# Patient Record
Sex: Female | Born: 1940
Health system: Southern US, Community
[De-identification: ages and names within clinical notes are randomized; demographics above are authoritative.]

## PROBLEM LIST (undated history)

## (undated) DIAGNOSIS — Z79890 Hormone replacement therapy (postmenopausal): Secondary | ICD-10-CM

## (undated) DIAGNOSIS — M1990 Unspecified osteoarthritis, unspecified site: Secondary | ICD-10-CM

## (undated) DIAGNOSIS — N200 Calculus of kidney: Secondary | ICD-10-CM

## (undated) DIAGNOSIS — R918 Other nonspecific abnormal finding of lung field: Secondary | ICD-10-CM

## (undated) DIAGNOSIS — E785 Hyperlipidemia, unspecified: Secondary | ICD-10-CM

## (undated) DIAGNOSIS — I7300 Raynaud's syndrome without gangrene: Secondary | ICD-10-CM

## (undated) DIAGNOSIS — C801 Malignant (primary) neoplasm, unspecified: Secondary | ICD-10-CM

## (undated) HISTORY — PX: LITHOTRIPSY: SUR834

## (undated) HISTORY — DX: Other nonspecific abnormal finding of lung field: R91.8

## (undated) HISTORY — DX: Raynaud's syndrome without gangrene: I73.00

## (undated) HISTORY — DX: Calculus of kidney: N20.0

## (undated) HISTORY — PX: TONSILLECTOMY: SUR1361

## (undated) HISTORY — DX: Hormone replacement therapy: Z79.890

## (undated) HISTORY — DX: Hyperlipidemia, unspecified: E78.5

## (undated) HISTORY — DX: Unspecified osteoarthritis, unspecified site: M19.90

---

## 2004-12-28 ENCOUNTER — Ambulatory Visit: Payer: Self-pay | Admitting: Unknown Physician Specialty

## 2005-08-03 ENCOUNTER — Ambulatory Visit: Payer: Self-pay | Admitting: General Surgery

## 2005-08-03 HISTORY — PX: OTHER SURGICAL HISTORY: SHX169

## 2006-01-24 ENCOUNTER — Ambulatory Visit: Payer: Self-pay | Admitting: Unknown Physician Specialty

## 2007-02-06 ENCOUNTER — Ambulatory Visit: Payer: Self-pay | Admitting: Unknown Physician Specialty

## 2008-02-26 ENCOUNTER — Ambulatory Visit: Payer: Self-pay | Admitting: Unknown Physician Specialty

## 2008-03-15 ENCOUNTER — Ambulatory Visit: Payer: Self-pay | Admitting: Unknown Physician Specialty

## 2009-03-18 ENCOUNTER — Ambulatory Visit: Payer: Self-pay | Admitting: Unknown Physician Specialty

## 2010-03-31 ENCOUNTER — Ambulatory Visit: Payer: Self-pay | Admitting: Unknown Physician Specialty

## 2011-05-11 LAB — LIPID PANEL
Cholesterol: 167 mg/dL (ref 0–200)
HDL: 43 mg/dL (ref 35–70)
LDL Cholesterol: 100 mg/dL

## 2011-05-11 LAB — CBC AND DIFFERENTIAL
Platelets: 249 10*3/uL (ref 150–399)
WBC: 5.9 10^3/mL

## 2011-06-29 ENCOUNTER — Ambulatory Visit: Payer: Self-pay | Admitting: Unknown Physician Specialty

## 2011-06-29 LAB — HM MAMMOGRAPHY

## 2011-12-10 LAB — HM DEXA SCAN: HM Dexa Scan: NORMAL

## 2012-07-04 ENCOUNTER — Ambulatory Visit: Payer: Self-pay | Admitting: Internal Medicine

## 2012-11-18 ENCOUNTER — Ambulatory Visit: Payer: Self-pay | Admitting: Internal Medicine

## 2012-12-09 ENCOUNTER — Ambulatory Visit: Payer: Self-pay | Admitting: Internal Medicine

## 2012-12-26 ENCOUNTER — Encounter: Payer: Self-pay | Admitting: *Deleted

## 2012-12-29 ENCOUNTER — Encounter: Payer: Self-pay | Admitting: Internal Medicine

## 2012-12-29 ENCOUNTER — Ambulatory Visit (INDEPENDENT_AMBULATORY_CARE_PROVIDER_SITE_OTHER): Payer: Medicare Other | Admitting: Internal Medicine

## 2012-12-29 VITALS — BP 140/82 | HR 68 | Temp 98.4°F | Ht 66.5 in | Wt 151.5 lb

## 2012-12-29 DIAGNOSIS — E7800 Pure hypercholesterolemia, unspecified: Secondary | ICD-10-CM

## 2012-12-29 DIAGNOSIS — R5381 Other malaise: Secondary | ICD-10-CM

## 2012-12-29 DIAGNOSIS — R5383 Other fatigue: Secondary | ICD-10-CM

## 2012-12-29 DIAGNOSIS — N200 Calculus of kidney: Secondary | ICD-10-CM

## 2012-12-29 DIAGNOSIS — E559 Vitamin D deficiency, unspecified: Secondary | ICD-10-CM

## 2012-12-29 DIAGNOSIS — C4490 Unspecified malignant neoplasm of skin, unspecified: Secondary | ICD-10-CM

## 2012-12-30 ENCOUNTER — Encounter: Payer: Self-pay | Admitting: Internal Medicine

## 2012-12-30 DIAGNOSIS — E559 Vitamin D deficiency, unspecified: Secondary | ICD-10-CM | POA: Insufficient documentation

## 2012-12-30 DIAGNOSIS — C4490 Unspecified malignant neoplasm of skin, unspecified: Secondary | ICD-10-CM | POA: Insufficient documentation

## 2012-12-30 DIAGNOSIS — N200 Calculus of kidney: Secondary | ICD-10-CM | POA: Insufficient documentation

## 2012-12-30 DIAGNOSIS — E7800 Pure hypercholesterolemia, unspecified: Secondary | ICD-10-CM | POA: Insufficient documentation

## 2012-12-30 NOTE — Assessment & Plan Note (Signed)
Currently asymptomatic.  Follow.     

## 2012-12-30 NOTE — Assessment & Plan Note (Signed)
On simvastatin.  Low cholesterol diet and exercise.  Just had cholesterol checked 12/13.  Obtain results.

## 2012-12-30 NOTE — Assessment & Plan Note (Signed)
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  Seeing Dr Isenstein.    

## 2012-12-30 NOTE — Assessment & Plan Note (Signed)
Documented in pt notes.  Continue supplements.  Follow.   

## 2012-12-30 NOTE — Progress Notes (Signed)
Subjective:    Patient ID: Kristin Phelps, female    DOB: [birth year], 72 y.o.   MRN: [id number]  HPI 72 year old female with past history of hypercholesterolemia and nephrolithiasis who comes in today to follow up on these issues as well as to establish care.  She previously saw Dr Lin Givens and recently has been seeing Dr Alison Murray.  She states she is doing well.  Is active.  No cardiac symptoms with increased activity or exertion.  Breathing stable.  Had her last physical 05/20/12.  Was having issues with back pain in 12/13.  Xray revealed arthritis.  Better now.  Occasionally will notice some minimal discomfort with certain movements.   No significant pain or problems now.  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  Busy season starting.  Bowels stable.  Overall she feels she is doing well.    Past Medical History  Diagnosis Date  . GERD (gastroesophageal reflux disease)   . Hyperlipidemia   . Postmenopausal HRT (hormone replacement therapy)   . Nephrolithiasis   . Arthritis     Current Outpatient Prescriptions on File Prior to Visit  Medication Sig Dispense Refill  . estropipate (OGEN) 0.75 MG tablet Take one tablet by mouth four days weekly.      . medroxyPROGESTERone (PROVERA) 2.5 MG tablet Take 2.5 mg by mouth daily.      . simvastatin (ZOCOR) 20 MG tablet Take 20 mg by mouth daily.        Review of Systems Patient denies any headache, lightheadedness or dizziness.  No sinus or allergy symptoms.   No chest pain, tightness or palpitations.  No increased shortness of breath, cough or congestion.  No nausea or vomiting.  No acid reflux.  No abdominal pain or cramping.  No bowel change, such as diarrhea, constipation, BRBPR or melana.  No urine change.   Taking her estrogen four days per week.  Discussed my desire to gradually wean her off.  Has been on for years.  States she feels better on the estrogen and would like to remain on the estrogen for now.  Some fatigue, but  overall feels she is doing well.      Objective:   Physical Exam Filed Vitals:   12/29/12 1433  BP: 140/82  Pulse: 68  Temp: 98.4 F (36.9 C)   Blood pressure recheck:  57/75  72 year old female in no acute distress.   HEENT:  Nares- clear.  Oropharynx - without lesions. NECK:  Supple.  Nontender.  No audible bruit.  HEART:  Appears to be regular. LUNGS:  No crackles or wheezing audible.  Respirations even and unlabored.  RADIAL PULSE:  Equal bilaterally.    BREASTS:  Performed by Dr Alison Murray in 6/13.  ABDOMEN:  Soft, nontender.  Bowel sounds present and normal.  No audible abdominal bruit.  GU:  Performed by Dr Alison Murray in 6/13.  EXTREMITIES:  No increased edema present.  DP pulses palpable and equal bilaterally.          Assessment & Plan:  ESTROGEN USAGE.  Discussed continued use with her.  She has been on - for years.  Desires to remain on the estrogen for now.  Ttaking the estrogen - 4 days/week.  Will discuss more with her in the future.  Has a family history of breast cancer.  Discussed this with her as well.    FAMILY HISTORY OF BREAST CANCER.  Discussed genetic testing.  BACK PAIN.  Not a significant issue for her now.  Follow.    FAMILY HISTORY OF ANEURYSM.  Discussed screening.    HEALTH MAINTENANCE.  Schedule a physical in 6/14.  Last 05/20/12.  Mammogram 07/04/12 - per her report - ok.  Obtain results.  Colonoscopy 2005.  Recommended follow up colonoscopy 10 years.  Declines vaccinations.  Bone density 12/10/11 normal.

## 2013-06-23 ENCOUNTER — Other Ambulatory Visit (INDEPENDENT_AMBULATORY_CARE_PROVIDER_SITE_OTHER): Payer: Medicare Other

## 2013-06-23 DIAGNOSIS — R5383 Other fatigue: Secondary | ICD-10-CM

## 2013-06-23 DIAGNOSIS — E7800 Pure hypercholesterolemia, unspecified: Secondary | ICD-10-CM

## 2013-06-23 LAB — LIPID PANEL
Cholesterol: 172 mg/dL (ref 0–200)
HDL: 45.9 mg/dL
LDL Cholesterol: 103 mg/dL — ABNORMAL HIGH (ref 0–99)
Total CHOL/HDL Ratio: 4
Triglycerides: 114 mg/dL (ref 0.0–149.0)
VLDL: 22.8 mg/dL (ref 0.0–40.0)

## 2013-06-23 LAB — CBC WITH DIFFERENTIAL/PLATELET
Basophils Relative: 1.1 % (ref 0.0–3.0)
Eosinophils Absolute: 0.3 10*3/uL (ref 0.0–0.7)
Eosinophils Relative: 4.8 % (ref 0.0–5.0)
Hemoglobin: 13.1 g/dL (ref 12.0–15.0)
Lymphocytes Relative: 15.8 % (ref 12.0–46.0)
MCHC: 33.8 g/dL (ref 30.0–36.0)
Neutro Abs: 4.8 10*3/uL (ref 1.4–7.7)
Neutrophils Relative %: 71 % (ref 43.0–77.0)
RBC: 4.29 Mil/uL (ref 3.87–5.11)
WBC: 6.8 10*3/uL (ref 4.5–10.5)

## 2013-06-23 LAB — COMPREHENSIVE METABOLIC PANEL
Albumin: 3.8 g/dL (ref 3.5–5.2)
BUN: 14 mg/dL (ref 6–23)
CO2: 27 mEq/L (ref 19–32)
Calcium: 9.2 mg/dL (ref 8.4–10.5)
Chloride: 107 mEq/L (ref 96–112)
Creatinine, Ser: 1.1 mg/dL (ref 0.4–1.2)
GFR: 54.46 mL/min — ABNORMAL LOW (ref >60.00)
Glucose, Bld: 90 mg/dL (ref 70–99)
Potassium: 4.2 mEq/L (ref 3.5–5.1)

## 2013-06-23 LAB — TSH: TSH: 3.33 u[IU]/mL (ref 0.35–5.50)

## 2013-06-30 ENCOUNTER — Ambulatory Visit (INDEPENDENT_AMBULATORY_CARE_PROVIDER_SITE_OTHER): Payer: Medicare Other | Admitting: Internal Medicine

## 2013-06-30 ENCOUNTER — Encounter: Payer: Self-pay | Admitting: Internal Medicine

## 2013-06-30 VITALS — BP 120/70 | HR 73 | Temp 98.9°F | Ht 65.0 in | Wt 155.5 lb

## 2013-06-30 DIAGNOSIS — R079 Chest pain, unspecified: Secondary | ICD-10-CM

## 2013-06-30 DIAGNOSIS — E7800 Pure hypercholesterolemia, unspecified: Secondary | ICD-10-CM

## 2013-06-30 DIAGNOSIS — C4490 Unspecified malignant neoplasm of skin, unspecified: Secondary | ICD-10-CM

## 2013-06-30 DIAGNOSIS — N200 Calculus of kidney: Secondary | ICD-10-CM

## 2013-06-30 DIAGNOSIS — E559 Vitamin D deficiency, unspecified: Secondary | ICD-10-CM

## 2013-06-30 MED ORDER — SIMVASTATIN 20 MG PO TABS: 20.0000 mg | ORAL_TABLET | Freq: Every day | ORAL | Status: DC

## 2013-06-30 MED ORDER — ESTROPIPATE 0.75 MG PO TABS: ORAL_TABLET | ORAL | Status: DC

## 2013-06-30 MED ORDER — MEDROXYPROGESTERONE ACETATE 2.5 MG PO TABS: 2.5000 mg | ORAL_TABLET | Freq: Every day | ORAL | Status: DC

## 2013-06-30 NOTE — Progress Notes (Signed)
Subjective:    Patient ID: Kristin Phelps, female    DOB: 04/11/1941, 72 y.o.   MRN: [id number]  HPI 72 year old female with past history of hypercholesterolemia and nephrolithiasis who comes in today to follow up on these issues as well as for a complete physical exam.   She states she is doing well.  Is active.  No cardiac symptoms with increased activity or exertion.  Has noticed some occasional chest discomfort.  No clear trigger.   Breathing stable.  No increased sob.   Was having issues with back pain in 12/13.  Xray revealed arthritis.  Better now.  Occasionally will notice some minimal discomfort with certain movements.   No significant pain or problems now.  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  Has leveled off now.  Bowels stable.      Past Medical History  Diagnosis Date  . GERD (gastroesophageal reflux disease)   . Hyperlipidemia   . Postmenopausal HRT (hormone replacement therapy)   . Nephrolithiasis   . Arthritis     Current Outpatient Prescriptions on File Prior to Visit  Medication Sig Dispense Refill  . Cholecalciferol (VITAMIN D-3) 1000 UNITS CAPS Take 1 capsule by mouth daily.      Marland Kitchen estropipate (OGEN) 0.75 MG tablet Take one tablet by mouth four days weekly.      . medroxyPROGESTERone (PROVERA) 2.5 MG tablet Take 2.5 mg by mouth daily.      . polycarbophil (FIBERCON) 625 MG tablet Take 625 mg by mouth daily.      . simvastatin (ZOCOR) 20 MG tablet Take 20 mg by mouth daily.       No current facility-administered medications on file prior to visit.    Review of Systems Patient denies any headache, lightheadedness or dizziness.  No sinus or allergy symptoms.   No palpitations.  Some intermittent chest discomfort.  Last very short time.  Resolves on its own.  No pain with increased activity or exertion.  No increased shortness of breath, cough or congestion.  No nausea or vomiting.  No acid  reflux.  No abdominal pain or cramping.  No bowel change, such as diarrhea, constipation, BRBPR or melana.  No urine change.   Taking her estrogen four days per week.  Discussed my desire to gradually wean her off.  Has been on for years.  States she feels better on the estrogen and would like to remain on the estrogen for now.  Blood pressures better.       Objective:   Physical Exam  Filed Vitals:   06/30/13 0836  BP: 120/70  Pulse: 73  Temp: 98.9 F (37.2 C)   Pulse 48  72 year old female in no acute distress.   HEENT:  Nares- clear.  Oropharynx - without lesions. NECK:  Supple.  Nontender.  No audible bruit.  HEART:  Appears to be regular. LUNGS:  No crackles or wheezing audible.  Respirations even and unlabored.  RADIAL PULSE:  Equal bilaterally.    BREASTS:  No nipple discharge or nipple retraction present.  Could not appreciate any distinct nodules or axillary adenopathy.  ABDOMEN:  Soft, nontender.  Bowel sounds present and normal.  No audible abdominal bruit.  GU:  Not performed.    EXTREMITIES:  No increased edema present.  DP pulses palpable and equal bilaterally.          Assessment & Plan:  CARDIOLOGY.  Intermittent pain as outlined.  EKG revealed SR with no acute ischemic changes.  Discussed further w/up including stress test.  She declined.  Wants to continue to monitor.  Will notify me if she changes her mind or if any change, worsening or persistent symptoms or problems.    ESTROGEN USAGE.  Discussed continued use with her.  She has been on - for years.  Desires to remain on the estrogen for now.  Ttaking the estrogen - 4 days/week.  Has a family history of breast cancer.  Discussed this with her as well.  Desires to remain on estrogen.   PREVIOUS ELEVATED BLOOD PRESSURE.  Blood pressure better now.  Follow.    FAMILY HISTORY OF BREAST CANCER.  Discussed genetic testing.    BACK PAIN.  Not a significant issue for her now.  Follow.    FAMILY HISTORY OF ANEURYSM.   Have discussed screening.    HEALTH MAINTENANCE.  Physical today.    Mammogram 07/04/12 - per her report - ok.  Gave her the information to schedule her own mammogram.   Colonoscopy 2005.  Recommended follow up colonoscopy 10 years.  Declines vaccinations.  Bone density 12/10/11 normal.

## 2013-07-04 ENCOUNTER — Encounter: Payer: Self-pay | Admitting: Internal Medicine

## 2013-07-04 NOTE — Assessment & Plan Note (Signed)
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  Seeing Dr Isenstein.    

## 2013-07-04 NOTE — Assessment & Plan Note (Signed)
Documented in pt notes.  Continue supplements.  Follow.   

## 2013-07-04 NOTE — Assessment & Plan Note (Signed)
Currently asymptomatic.  Follow.     

## 2013-07-04 NOTE — Assessment & Plan Note (Signed)
On simvastatin.  Low cholesterol diet and exercise.  Check lipid panel and liver function.  

## 2013-07-10 ENCOUNTER — Telehealth: Payer: Self-pay | Admitting: *Deleted

## 2013-07-10 NOTE — Telephone Encounter (Signed)
Left message for pt to return my call- Need to find out if pt has tried any other form of Estrogen? If not, is she willing to try another type? Insurance is not covering the Estropipate 0.75mg  tabs.

## 2013-07-16 ENCOUNTER — Encounter: Payer: Self-pay | Admitting: Internal Medicine

## 2013-07-20 ENCOUNTER — Other Ambulatory Visit: Payer: Self-pay | Admitting: *Deleted

## 2013-07-22 ENCOUNTER — Telehealth: Payer: Self-pay | Admitting: *Deleted

## 2013-07-22 NOTE — Telephone Encounter (Signed)
Called [employer] of  for prior authorization on the Estropipate 0.75 mg, received form, placed on Dr.Scotts folder

## 2013-07-24 ENCOUNTER — Ambulatory Visit: Payer: Self-pay | Admitting: Internal Medicine

## 2013-07-24 LAB — HM MAMMOGRAPHY

## 2013-08-04 ENCOUNTER — Telehealth: Payer: Self-pay | Admitting: Internal Medicine

## 2013-08-04 NOTE — Telephone Encounter (Signed)
My chart message sent to pt regarding her estrogen.

## 2013-08-06 ENCOUNTER — Encounter: Payer: Self-pay | Admitting: *Deleted

## 2013-08-06 ENCOUNTER — Encounter: Payer: Self-pay | Admitting: Internal Medicine

## 2013-08-10 ENCOUNTER — Telehealth: Payer: Self-pay | Admitting: *Deleted

## 2013-08-10 NOTE — Telephone Encounter (Signed)
Called insurance again, still haven't received form of estrogen that pt plans cover, they stated they are faxing over information now

## 2013-08-11 NOTE — Telephone Encounter (Signed)
[employer] called, estropipate (OGEN) 0.75 MG tablet has been APPROVED and pt has been notified

## 2013-10-08 ENCOUNTER — Other Ambulatory Visit: Payer: Self-pay

## 2013-11-17 ENCOUNTER — Telehealth: Payer: Self-pay | Admitting: Internal Medicine

## 2013-11-17 NOTE — Telephone Encounter (Signed)
Please advise if you would like to work her in somewhere this week for appt. Pt hasn't been seen since July

## 2013-11-17 NOTE — Telephone Encounter (Signed)
I would normally work her in this week, but if she is coughing up rusty colored/blood tinged sputum - I recommend appt today to confirm nothing more going on and then I can f/u with her.

## 2013-11-17 NOTE — Telephone Encounter (Signed)
Patient Information:  Caller Name: [place]  Phone: [phone]  Patient: Kristin Phelps, Kristin Phelps  Gender: Female  DOB: 1941-03-20  Age: 72 Years  PCP: Duncan Dull (Adults only)  Office Follow Up:  Does the office need to follow up with this patient?: Yes  Instructions For The Office: Please call and advise if this is possible.   Symptoms  Reason For Call & Symptoms: Pt is calling to see if a z-pac could be prescribed for her sx since there are no appts. Pt has had a cold/sore throat and productive cough x 10 days. Pt has productive yellow/green sputum with some blood streaks.  Reviewed Health History In EMR: Yes  Reviewed Medications In EMR: Yes  Reviewed Allergies In EMR: Yes  Reviewed Surgeries / Procedures: Yes  Date of Onset of Symptoms: 11/07/2013  Treatments Tried: salt water gargles; Mucinex  Treatments Tried Worked: No  Guideline(s) Used:  Cough  Disposition Per Guideline:   See Today in Office  Reason For Disposition Reached:   Coughing up rusty-colored (reddish-brown) or blood-tinged sputum  Advice Given:  N/A  Patient Refused Recommendation:  Patient Requests Prescription  Pt requesting a zpac be called into [facility].

## 2013-11-18 NOTE — Telephone Encounter (Signed)
Spoke with pt, states she continues to have cough with yellow-green sputum, blood was only seen when blowing her nose. Temp was below 100 today, has been using Mucinex earlier in the week with relief of symptoms. Pt took appt on 11/20/13 at 9:00, but states she will call and cancel if symptoms improve before then.

## 2013-11-18 NOTE — Telephone Encounter (Signed)
I can see her at 9:00 on 11/20/13 for evaluation if she is not going today.   If blood or worsening sx - needs eval prior.  thanks

## 2013-11-18 NOTE — Telephone Encounter (Signed)
Pt states that she would rather just try to "stick it out" - I informed pt to go to Acute care, Urgent Care, or ER if sx's conitnue or worsen. Pt went to work today & did okay (Hoarse now)

## 2013-11-20 ENCOUNTER — Ambulatory Visit (INDEPENDENT_AMBULATORY_CARE_PROVIDER_SITE_OTHER): Payer: Medicare Other | Admitting: Internal Medicine

## 2013-11-20 ENCOUNTER — Ambulatory Visit (INDEPENDENT_AMBULATORY_CARE_PROVIDER_SITE_OTHER)
Admission: RE | Admit: 2013-11-20 | Discharge: 2013-11-20 | Disposition: A | Discharge status: Home / Self Care | Payer: Medicare Other | Source: Ambulatory Visit | Attending: Internal Medicine | Admitting: Internal Medicine

## 2013-11-20 ENCOUNTER — Encounter: Payer: Self-pay | Admitting: Internal Medicine

## 2013-11-20 VITALS — BP 124/70 | HR 68 | Temp 98.8°F | Ht 65.0 in | Wt 158.8 lb

## 2013-11-20 DIAGNOSIS — R05 Cough: Secondary | ICD-10-CM

## 2013-11-20 DIAGNOSIS — J069 Acute upper respiratory infection, unspecified: Secondary | ICD-10-CM

## 2013-11-20 MED ORDER — AZITHROMYCIN 250 MG PO TABS: ORAL_TABLET | ORAL | Status: DC

## 2013-11-20 NOTE — Progress Notes (Signed)
Pre-visit discussion using our clinic review tool. No additional management support is needed unless otherwise documented below in the visit note.  

## 2013-11-20 NOTE — Patient Instructions (Signed)
Robitussin as directed.  Saline nasal spray - flush nose at least 2-3x/day.

## 2013-11-22 ENCOUNTER — Encounter: Payer: Self-pay | Admitting: Internal Medicine

## 2013-11-22 DIAGNOSIS — R9389 Abnormal findings on diagnostic imaging of other specified body structures: Secondary | ICD-10-CM | POA: Insufficient documentation

## 2013-11-22 NOTE — Assessment & Plan Note (Signed)
Sinusitis/uri.  Symptoms as outlined.  Denies any gross hemoptysis.  Treat with zpak as directed.  mucinex and robitussin as directed.  Flonase nasal spray and saline nasal spray as directed.  Follow.  Explained to her if symptoms changed, worsened or did not resolve - needs to be reevaluated.  Discussed if any hemoptysis - evaluation immediately.

## 2013-11-22 NOTE — Progress Notes (Signed)
  Subjective:    Patient ID: Kristin Phelps, female    DOB: 17-Aug-1941, 72 y.o.   MRN: [id number]  Cough  72 year old female with past history of hypercholesterolemia and nephrolithiasis who comes in today as a work in with concerns regarding a scratchy throat and cough and congestion.  She reports increased nasal congestion - bloody and green mucus.  She reports some wheezing at times.  No sob.  Some soreness from coughing.   Low grade fever.  No vomiting, nausea or diarrhea.  Also reports some cough - productive.  Some blood tinged mucus - productive.  No gross hemoptysis.  Eating and drinking.      Past Medical History  Diagnosis Date  . GERD (gastroesophageal reflux disease)   . Hyperlipidemia   . Postmenopausal HRT (hormone replacement therapy)   . Nephrolithiasis   . Arthritis     Current Outpatient Prescriptions on File Prior to Visit  Medication Sig Dispense Refill  . Cholecalciferol (VITAMIN D-3) 1000 UNITS CAPS Take 1 capsule by mouth daily.      Marland Kitchen estropipate (OGEN) 0.75 MG tablet 1/2 tablet q day  90 tablet  1  . medroxyPROGESTERone (PROVERA) 2.5 MG tablet Take 1 tablet (2.5 mg total) by mouth daily.  90 tablet  3  . polycarbophil (FIBERCON) 625 MG tablet Take 625 mg by mouth daily.      . simvastatin (ZOCOR) 20 MG tablet Take 1 tablet (20 mg total) by mouth daily.  90 tablet  3   No current facility-administered medications on file prior to visit.    Review of Systems  Respiratory: Positive for cough.   Patient denies any headache, lightheadedness or dizziness.  Nasal congestion and colored/blood mucus.  Increased drainage.  No sob.  Increased cough.  Productive as outlined.  No palpitations.   No increased shortness of breath.   No nausea or vomiting.  No acid reflux.  No abdominal pain or cramping.  No bowel change, such as diarrhea.   Low grade fever.        Objective:   Physical Exam  Filed Vitals:   11/20/13 0856  BP: 124/70  Pulse: 68  Temp: 98.8 F (91.49  C)   72 year old female in no acute distress.   HEENT:  Nares- clear.  Oropharynx - without lesions. NECK:  Supple.  Nontender.  No audible bruit.  HEART:  Appears to be regular. LUNGS:  No crackles or wheezing audible.  Some congestion - heard more in the RLL.  Cleared with coughing.  Respirations even and unlabored.  RADIAL PULSE:  Equal bilaterally.          Assessment & Plan:  PREVIOUS ELEVATED BLOOD PRESSURE.  Blood pressure better now.  Follow.    HEALTH MAINTENANCE.  Physical last visit.    Mammogram 07/24/13 - negative.  Colonoscopy 2005.  Recommended follow up colonoscopy 10 years.  Declines vaccinations.  Bone density 12/10/11 normal.

## 2013-11-23 NOTE — Progress Notes (Signed)
CT has stated we can do a CT W/O CM.

## 2013-12-02 ENCOUNTER — Ambulatory Visit: Payer: Self-pay | Admitting: Internal Medicine

## 2013-12-04 ENCOUNTER — Encounter: Payer: Self-pay | Admitting: Internal Medicine

## 2013-12-09 NOTE — Telephone Encounter (Signed)
Called pt and discussed CT findings with her.  Discussed my desire for referral to Dr Oliva Bustard for evaluation.  She is feeling good.  Wants to hold on further w/up at this time.  Has appt with me at the end of January.  Wants to wait until April for appt.  Will call me if she changes her mind or if any problems.

## 2013-12-22 ENCOUNTER — Other Ambulatory Visit (INDEPENDENT_AMBULATORY_CARE_PROVIDER_SITE_OTHER): Payer: Medicare HMO

## 2013-12-22 ENCOUNTER — Encounter: Payer: Self-pay | Admitting: Internal Medicine

## 2013-12-22 DIAGNOSIS — E7800 Pure hypercholesterolemia, unspecified: Secondary | ICD-10-CM

## 2013-12-22 LAB — HEPATIC FUNCTION PANEL
ALT: 21 U/L (ref 0–35)
AST: 24 U/L (ref 0–37)
Albumin: 3.8 g/dL (ref 3.5–5.2)
Alkaline Phosphatase: 60 U/L (ref 39–117)
BILIRUBIN TOTAL: 0.8 mg/dL (ref 0.3–1.2)
Bilirubin, Direct: 0 mg/dL (ref 0.0–0.3)
TOTAL PROTEIN: 6.7 g/dL (ref 6.0–8.3)

## 2013-12-22 LAB — LIPID PANEL
CHOLESTEROL: 163 mg/dL (ref 0–200)
HDL: 49.2 mg/dL (ref >39.00)
LDL Cholesterol: 89 mg/dL (ref 0–99)
TRIGLYCERIDES: 123 mg/dL (ref 0.0–149.0)
Total CHOL/HDL Ratio: 3
VLDL: 24.6 mg/dL (ref 0.0–40.0)

## 2013-12-25 ENCOUNTER — Encounter: Payer: Self-pay | Admitting: Internal Medicine

## 2013-12-25 ENCOUNTER — Ambulatory Visit (INDEPENDENT_AMBULATORY_CARE_PROVIDER_SITE_OTHER): Payer: Medicare HMO | Admitting: Internal Medicine

## 2013-12-25 VITALS — BP 126/70 | HR 64 | Temp 98.5°F | Ht 65.0 in | Wt 158.8 lb

## 2013-12-25 DIAGNOSIS — N200 Calculus of kidney: Secondary | ICD-10-CM

## 2013-12-25 DIAGNOSIS — J069 Acute upper respiratory infection, unspecified: Secondary | ICD-10-CM

## 2013-12-25 DIAGNOSIS — E7800 Pure hypercholesterolemia, unspecified: Secondary | ICD-10-CM

## 2013-12-25 DIAGNOSIS — E559 Vitamin D deficiency, unspecified: Secondary | ICD-10-CM

## 2013-12-25 DIAGNOSIS — C4490 Unspecified malignant neoplasm of skin, unspecified: Secondary | ICD-10-CM

## 2013-12-25 NOTE — Progress Notes (Signed)
Pre-visit discussion using our clinic review tool. No additional management support is needed unless otherwise documented below in the visit note.  

## 2013-12-27 ENCOUNTER — Encounter: Payer: Self-pay | Admitting: Internal Medicine

## 2013-12-27 NOTE — Progress Notes (Signed)
Subjective:    Patient ID: Kristin Phelps, female    DOB: 1941/04/04, 73 y.o.   MRN: [id number]  HPI 73 year old female with past history of hypercholesterolemia and nephrolithiasis who comes in today for a scheduled follow up.  She states she is doing well.  Is active.  No cardiac symptoms with increased activity or exertion.  No significant chest discomfort.   No clear trigger.   Breathing stable.  No increased sob.  No increased cough.  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  Has leveled off now.  Tax season picking back up.  Bowels stable.      Past Medical History  Diagnosis Date  . Raynaud's phenomenon   . Hyperlipidemia   . Postmenopausal HRT (hormone replacement therapy)   . Nephrolithiasis   . Arthritis     Current Outpatient Prescriptions on File Prior to Visit  Medication Sig Dispense Refill  . Cholecalciferol (VITAMIN D-3) 1000 UNITS CAPS Take 1 capsule by mouth daily.      Marland Kitchen estropipate (OGEN) 0.75 MG tablet 1/2 tablet q day  90 tablet  1  . medroxyPROGESTERone (PROVERA) 2.5 MG tablet Take 1 tablet (2.5 mg total) by mouth daily.  90 tablet  3  . polycarbophil (FIBERCON) 625 MG tablet Take 625 mg by mouth daily.      . simvastatin (ZOCOR) 20 MG tablet Take 1 tablet (20 mg total) by mouth daily.  90 tablet  3   No current facility-administered medications on file prior to visit.    Review of Systems Patient denies any headache, lightheadedness or dizziness.  No sinus or allergy symptoms.   No palpitations.   No chest pain with increased activity or exertion.  No increased shortness of breath.  No significant cough or congestion.   No nausea or vomiting.  No acid reflux.  No abdominal pain or cramping.  No bowel change, such as diarrhea, constipation, BRBPR or melana.  No urine change.   Taking her estrogen four days per week.  Discussed my desire to gradually wean her off.  Has been on for years.   States she feels better on the estrogen and would like to remain on the estrogen for now.  Blood pressures better.       Objective:   Physical Exam  Filed Vitals:   12/25/13 0759  BP: 126/70  Pulse: 64  Temp: 98.5 F (36.9 C)   Blood pressure recheck:  62/57  73 year old female in no acute distress.   HEENT:  Nares- clear.  Oropharynx - without lesions. NECK:  Supple.  Nontender.  No audible bruit.  HEART:  Appears to be regular. LUNGS:  No crackles or wheezing audible.  Respirations even and unlabored.  RADIAL PULSE:  Equal bilaterally.    ABDOMEN:  Soft, nontender.  Bowel sounds present and normal.  No audible abdominal bruit.    EXTREMITIES:  No increased edema present.  DP pulses palpable and equal bilaterally.          Assessment & Plan:  CARDIOLOGY.  Previous EKG revealed SR with no acute ischemic changes.  Discussed further w/up including stress test.  She has declined.   Wants to continue to monitor.  Will notify me if she changes her mind or if any change, worsening or persistent symptoms or problems.  No chest pain or tightness.    ESTROGEN USAGE.  Discussed continued use  with her.  She has been on - for years.  Desires to remain on the estrogen for now.  Ttaking the estrogen - 4 days/week.  Has a family history of breast cancer.  Discussed this with her as well.  Desires to remain on estrogen.   PREVIOUS ELEVATED BLOOD PRESSURE.  Blood pressure better now.  Follow.    FAMILY HISTORY OF BREAST CANCER.  Discussed genetic testing.    BACK PAIN.  Not a significant issue for her now.  Follow.    FAMILY HISTORY OF ANEURYSM.  Have discussed screening.    HEALTH MAINTENANCE.  Physical 06/30/13.    Mammogram 07/24/13 - per her report - ok.   Colonoscopy 2005.  Recommended follow up colonoscopy 10 years.  Declines vaccinations.  Bone density 12/10/11 normal.

## 2013-12-27 NOTE — Assessment & Plan Note (Signed)
Currently asymptomatic.  Follow.     

## 2013-12-27 NOTE — Assessment & Plan Note (Signed)
Previous uri.  Abnormal cxr.  Had CT chest.  Recommend further evaluation.  Dr Oliva Bustard reviewed.  Would like to refer her to Dr Oliva Bustard for further evaluation and management.

## 2013-12-27 NOTE — Assessment & Plan Note (Signed)
Documented in pt notes.  Continue supplements.  Follow.   

## 2013-12-27 NOTE — Assessment & Plan Note (Signed)
On simvastatin.  Low cholesterol diet and exercise.  Follow lipid panel and liver function.  Lipid panel wnl 12/22/13.

## 2013-12-27 NOTE — Assessment & Plan Note (Signed)
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  Seeing Dr Isenstein.    

## 2014-01-08 ENCOUNTER — Encounter: Payer: Self-pay | Admitting: Internal Medicine

## 2014-03-25 ENCOUNTER — Encounter: Payer: Self-pay | Admitting: Internal Medicine

## 2014-03-25 ENCOUNTER — Ambulatory Visit (INDEPENDENT_AMBULATORY_CARE_PROVIDER_SITE_OTHER): Payer: Commercial Managed Care - HMO | Admitting: Internal Medicine

## 2014-03-25 VITALS — BP 130/70 | HR 67 | Temp 98.6°F | Ht 65.0 in | Wt 155.5 lb

## 2014-03-25 DIAGNOSIS — C4490 Unspecified malignant neoplasm of skin, unspecified: Secondary | ICD-10-CM

## 2014-03-25 DIAGNOSIS — E559 Vitamin D deficiency, unspecified: Secondary | ICD-10-CM

## 2014-03-25 DIAGNOSIS — N200 Calculus of kidney: Secondary | ICD-10-CM

## 2014-03-25 DIAGNOSIS — R9389 Abnormal findings on diagnostic imaging of other specified body structures: Secondary | ICD-10-CM

## 2014-03-25 DIAGNOSIS — Z1211 Encounter for screening for malignant neoplasm of colon: Secondary | ICD-10-CM

## 2014-03-25 DIAGNOSIS — E7800 Pure hypercholesterolemia, unspecified: Secondary | ICD-10-CM

## 2014-03-25 NOTE — Progress Notes (Signed)
Subjective:    Patient ID: Kristin Phelps, female    DOB: 07-09-1941, 73 y.o.   MRN: [id number]  HPI 73 year old female with past history of hypercholesterolemia and nephrolithiasis who comes in today for a scheduled follow up.  She states she is doing well.  Is active.  No cardiac symptoms with increased activity or exertion.   Breathing stable.  No increased sob.  No increased cough.  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  Bowels stable.  She does report noticing some vaginal discharge at times.  No blood.  She is on estrogen and provera.  Only taking every other day.  We discussed trying to taper off the medication.  She desires to remain on her current dose.  I also discussed with her regarding my desire for further w/up and evaluation of the discharge.  She desires no further w/up at this time.  Wants to monitor.  No abdominal pian or cramping.      Past Medical History  Diagnosis Date  . Raynaud's phenomenon   . Hyperlipidemia   . Postmenopausal HRT (hormone replacement therapy)   . Nephrolithiasis   . Arthritis     Current Outpatient Prescriptions on File Prior to Visit  Medication Sig Dispense Refill  . Cholecalciferol (VITAMIN D-3) 1000 UNITS CAPS Take 1 capsule by mouth daily.      Marland Kitchen estropipate (OGEN) 0.75 MG tablet 1/2 tablet q day  90 tablet  1  . medroxyPROGESTERone (PROVERA) 2.5 MG tablet Take 1 tablet (2.5 mg total) by mouth daily.  90 tablet  3  . polycarbophil (FIBERCON) 625 MG tablet Take 625 mg by mouth daily.      . simvastatin (ZOCOR) 20 MG tablet Take 1 tablet (20 mg total) by mouth daily.  90 tablet  3   No current facility-administered medications on file prior to visit.    Review of Systems Patient denies any headache, lightheadedness or dizziness.  No sinus or allergy symptoms.   No palpitations.   No chest pain with increased activity or exertion.  No increased shortness  of breath.  No cough or congestion.   No nausea or vomiting.  No acid reflux.  No abdominal pain or cramping.  No bowel change, such as diarrhea, constipation, BRBPR or melana.  No urine change.   Taking her estrogen four days per week.  Discussed my desire to gradually wean her off.  Has been on for years.  States she feels better on the estrogen and would like to remain on the estrogen for now.  Blood pressures better.  Feels good.  Discharge as outlined.       Objective:   Physical Exam  Filed Vitals:   03/25/14 1451  BP: 130/70  Pulse: 67  Temp: 98.6 F (31 C)   73 year old female in no acute distress.   HEENT:  Nares- clear.  Oropharynx - without lesions. NECK:  Supple.  Nontender.  No audible bruit.  HEART:  Appears to be regular. LUNGS:  No crackles or wheezing audible.  Respirations even and unlabored.  RADIAL PULSE:  Equal bilaterally.    ABDOMEN:  Soft, nontender.  Bowel sounds present and normal.  No audible abdominal bruit.    EXTREMITIES:  No increased edema present.  DP pulses palpable and equal bilaterally.          Assessment & Plan:  CARDIOLOGY.  Previous EKG revealed SR with no acute ischemic changes.  Discussed further w/up including stress test.  She has declined.   Wants to continue to monitor.  Will notify me if she changes her mind or if any change, worsening or persistent symptoms or problems.  No chest pain or tightness now.  Feels good.    ESTROGEN USAGE.  Discussed continued use with her.  She has been on - for years.  Desires to remain on the estrogen for now.  Taking the estrogen - 4 days/week.  Has a family history of breast cancer.  Discussed this with her as well.  Desires to remain on estrogen and progesterone.    PREVIOUS ELEVATED BLOOD PRESSURE.  Blood pressure better now.  Follow.    FAMILY HISTORY OF BREAST CANCER.  Discussed genetic testing and my desire to have her off estrogen.    FAMILY HISTORY OF ANEURYSM.  Have discussed screening.     HEALTH MAINTENANCE.  Physical 06/30/13.    Mammogram 07/24/13 - per her report - ok.   Colonoscopy 2005.  Recommended follow up colonoscopy 10 years.  She declines f/u colonoscopy.  Did agree to IFOB.  Declines vaccinations.  Bone density 12/10/11 normal.   I spent 25 minutes with the patient and more than 50% of the tie was spent in consultation regarding the above.

## 2014-03-25 NOTE — Progress Notes (Signed)
Pre visit review using our clinic review tool, if applicable. No additional management support is needed unless otherwise documented below in the visit note. 

## 2014-03-28 ENCOUNTER — Telehealth: Payer: Self-pay | Admitting: Internal Medicine

## 2014-03-28 ENCOUNTER — Encounter: Payer: Self-pay | Admitting: Internal Medicine

## 2014-03-28 NOTE — Assessment & Plan Note (Signed)
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  Seeing Dr Isenstein.    

## 2014-03-28 NOTE — Assessment & Plan Note (Signed)
Previous abnormal chest CT.  This was done when pt had cough and congestion.  See my previous note for details.  Had wanted to refer to Dr Oliva Bustard.  She declined until after tax season.  She is currently asymptomatic.  She prefers to repeat the CT scan and then further w/up pending results.  Will schedule a f/u chest CT.

## 2014-03-28 NOTE — Telephone Encounter (Signed)
Pt was supposed to get IFOB when here.  I forgot.  Can we sent to her?  Thanks.

## 2014-03-28 NOTE — Assessment & Plan Note (Signed)
Currently asymptomatic.  Follow.     

## 2014-03-28 NOTE — Assessment & Plan Note (Signed)
Documented in pt notes.  Continue supplements.  Follow.   

## 2014-03-28 NOTE — Assessment & Plan Note (Signed)
On simvastatin.  Low cholesterol diet and exercise.  Follow lipid panel and liver function.  Lipid panel wnl 12/22/13.

## 2014-03-29 NOTE — Telephone Encounter (Signed)
Mailed to patient

## 2014-04-07 ENCOUNTER — Encounter: Payer: Self-pay | Admitting: Internal Medicine

## 2014-04-07 ENCOUNTER — Other Ambulatory Visit (INDEPENDENT_AMBULATORY_CARE_PROVIDER_SITE_OTHER): Payer: Commercial Managed Care - HMO

## 2014-04-07 DIAGNOSIS — Z1211 Encounter for screening for malignant neoplasm of colon: Secondary | ICD-10-CM

## 2014-04-07 LAB — FECAL OCCULT BLOOD, IMMUNOCHEMICAL: Fecal Occult Bld: NEGATIVE

## 2014-04-09 ENCOUNTER — Other Ambulatory Visit (INDEPENDENT_AMBULATORY_CARE_PROVIDER_SITE_OTHER): Payer: Commercial Managed Care - HMO

## 2014-04-09 DIAGNOSIS — R9389 Abnormal findings on diagnostic imaging of other specified body structures: Secondary | ICD-10-CM

## 2014-04-09 DIAGNOSIS — E559 Vitamin D deficiency, unspecified: Secondary | ICD-10-CM

## 2014-04-09 DIAGNOSIS — E7800 Pure hypercholesterolemia, unspecified: Secondary | ICD-10-CM

## 2014-04-09 LAB — CBC WITH DIFFERENTIAL/PLATELET
Basophils Absolute: 0.1 10*3/uL (ref 0.0–0.1)
Basophils Relative: 0.8 % (ref 0.0–3.0)
EOS PCT: 9.3 % — AB (ref 0.0–5.0)
Eosinophils Absolute: 0.6 10*3/uL (ref 0.0–0.7)
HEMATOCRIT: 39.5 % (ref 36.0–46.0)
Hemoglobin: 13.2 g/dL (ref 12.0–15.0)
Lymphocytes Relative: 19.5 % (ref 12.0–46.0)
Lymphs Abs: 1.3 10*3/uL (ref 0.7–4.0)
MCHC: 33.4 g/dL (ref 30.0–36.0)
MCV: 90.1 fl (ref 78.0–100.0)
MONOS PCT: 7.3 % (ref 3.0–12.0)
Monocytes Absolute: 0.5 10*3/uL (ref 0.1–1.0)
NEUTROS ABS: 4.2 10*3/uL (ref 1.4–7.7)
Neutrophils Relative %: 63.1 % (ref 43.0–77.0)
Platelets: 256 10*3/uL (ref 150.0–400.0)
RBC: 4.38 Mil/uL (ref 3.87–5.11)
RDW: 13.1 % (ref 11.5–15.5)
WBC: 6.6 10*3/uL (ref 4.0–10.5)

## 2014-04-09 LAB — HEPATIC FUNCTION PANEL
ALK PHOS: 60 U/L (ref 39–117)
ALT: 28 U/L (ref 0–35)
AST: 33 U/L (ref 0–37)
Albumin: 3.8 g/dL (ref 3.5–5.2)
BILIRUBIN DIRECT: 0 mg/dL (ref 0.0–0.3)
Total Bilirubin: 0.7 mg/dL (ref 0.2–1.2)
Total Protein: 6.3 g/dL (ref 6.0–8.3)

## 2014-04-09 LAB — LIPID PANEL
CHOL/HDL RATIO: 3
Cholesterol: 156 mg/dL (ref 0–200)
HDL: 52.2 mg/dL (ref >39.00)
LDL Cholesterol: 79 mg/dL (ref 0–99)
Triglycerides: 125 mg/dL (ref 0.0–149.0)
VLDL: 25 mg/dL (ref 0.0–40.0)

## 2014-04-09 LAB — BASIC METABOLIC PANEL
BUN: 10 mg/dL (ref 6–23)
CALCIUM: 9.3 mg/dL (ref 8.4–10.5)
CO2: 27 mEq/L (ref 19–32)
CREATININE: 1.1 mg/dL (ref 0.4–1.2)
Chloride: 105 mEq/L (ref 96–112)
GFR: 51.5 mL/min — ABNORMAL LOW (ref >60.00)
Glucose, Bld: 89 mg/dL (ref 70–99)
Potassium: 4.3 mEq/L (ref 3.5–5.1)
SODIUM: 141 meq/L (ref 135–145)

## 2014-04-09 LAB — TSH: TSH: 1.59 u[IU]/mL (ref 0.35–4.50)

## 2014-04-10 ENCOUNTER — Encounter: Payer: Self-pay | Admitting: Internal Medicine

## 2014-04-10 LAB — VITAMIN D 25 HYDROXY (VIT D DEFICIENCY, FRACTURES): Vit D, 25-Hydroxy: 51 ng/mL (ref 30–89)

## 2014-04-16 ENCOUNTER — Telehealth: Payer: Self-pay | Admitting: Internal Medicine

## 2014-04-16 NOTE — Telephone Encounter (Signed)
Received fax from [facility] back Auth# [id number] Treating Provider [facility] # of Visits 1 Start Date 6.9.15 End Date 9.7.15 CPT 71250 DX 793.2

## 2014-05-11 ENCOUNTER — Ambulatory Visit: Payer: Self-pay | Admitting: Internal Medicine

## 2014-05-20 ENCOUNTER — Ambulatory Visit: Payer: Self-pay | Admitting: Oncology

## 2014-05-20 ENCOUNTER — Ambulatory Visit: Payer: Self-pay | Admitting: Internal Medicine

## 2014-06-02 ENCOUNTER — Ambulatory Visit: Payer: Self-pay | Admitting: Oncology

## 2014-06-08 ENCOUNTER — Encounter: Payer: Commercial Managed Care - HMO | Admitting: Internal Medicine

## 2014-06-29 ENCOUNTER — Encounter: Payer: Self-pay | Admitting: Internal Medicine

## 2014-07-06 ENCOUNTER — Ambulatory Visit (INDEPENDENT_AMBULATORY_CARE_PROVIDER_SITE_OTHER): Payer: Commercial Managed Care - HMO | Admitting: Internal Medicine

## 2014-07-06 ENCOUNTER — Encounter: Payer: Self-pay | Admitting: Internal Medicine

## 2014-07-06 VITALS — BP 130/70 | HR 75 | Temp 98.9°F | Ht 65.0 in | Wt 158.5 lb

## 2014-07-06 DIAGNOSIS — E559 Vitamin D deficiency, unspecified: Secondary | ICD-10-CM

## 2014-07-06 DIAGNOSIS — C4490 Unspecified malignant neoplasm of skin, unspecified: Secondary | ICD-10-CM

## 2014-07-06 DIAGNOSIS — H9190 Unspecified hearing loss, unspecified ear: Secondary | ICD-10-CM

## 2014-07-06 DIAGNOSIS — N200 Calculus of kidney: Secondary | ICD-10-CM

## 2014-07-06 DIAGNOSIS — Z1239 Encounter for other screening for malignant neoplasm of breast: Secondary | ICD-10-CM

## 2014-07-06 DIAGNOSIS — E7800 Pure hypercholesterolemia, unspecified: Secondary | ICD-10-CM

## 2014-07-06 DIAGNOSIS — R9389 Abnormal findings on diagnostic imaging of other specified body structures: Secondary | ICD-10-CM

## 2014-07-06 NOTE — Progress Notes (Signed)
Pre visit review using our clinic review tool, if applicable. No additional management support is needed unless otherwise documented below in the visit note. 

## 2014-07-11 ENCOUNTER — Encounter: Payer: Self-pay | Admitting: Internal Medicine

## 2014-07-11 DIAGNOSIS — H9190 Unspecified hearing loss, unspecified ear: Secondary | ICD-10-CM | POA: Insufficient documentation

## 2014-07-11 NOTE — Assessment & Plan Note (Signed)
Currently asymptomatic.  Follow.

## 2014-07-11 NOTE — Progress Notes (Signed)
Subjective:    Patient ID: Kristin Phelps, female    DOB: 19-Oct-1941, 73 y.o.   MRN: [id number]  HPI 73 year old female with past history of hypercholesterolemia and nephrolithiasis who comes in today to follow up on these issues as well as for a complete physical exam.  She states she is doing well.  Is active.  No cardiac symptoms with increased activity or exertion.   Breathing stable.  No increased sob.  No increased cough.  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  Blood pressure is doing well, especially since tax season is over.  Bowels stable.  She had reported noticing some vaginal discharge at times.  See last note for details.  No blood.  She is on estrogen and provera.  Only taking every other day.  We discussed trying to taper off the medication.  She desires to remain on the estrogen and progesterone.  She still will notice some discharge.  We discussed my desire for further w/up and evaluation of the discharge.  She desires no further w/up at this time.  Wants to monitor.  No abdominal pian or cramping.   Now being followed by Dr Oliva Bustard for her abnormal CT chest.  Planning for f/u in 10/13.  Does report noticing some ringing in her ears (intermittently).  Was questioning her hearing.      Past Medical History  Diagnosis Date  . Raynaud's phenomenon   . Hyperlipidemia   . Postmenopausal HRT (hormone replacement therapy)   . Nephrolithiasis   . Arthritis     Current Outpatient Prescriptions on File Prior to Visit  Medication Sig Dispense Refill  . Cholecalciferol (VITAMIN D-3) 1000 UNITS CAPS Take 1 capsule by mouth daily.      Marland Kitchen estropipate (OGEN) 0.75 MG tablet 1/2 tablet q day  90 tablet  1  . medroxyPROGESTERone (PROVERA) 2.5 MG tablet Take 1 tablet (2.5 mg total) by mouth daily.  90 tablet  3  . polycarbophil (FIBERCON) 625 MG tablet Take 625 mg by mouth daily.      . simvastatin (ZOCOR) 20 MG tablet Take 1 tablet (20 mg total) by mouth daily.  90 tablet   3   No current facility-administered medications on file prior to visit.    Review of Systems Patient denies any headache, lightheadedness or dizziness.  No sinus or allergy symptoms.  Some decreased hearing and ringing intermittently.   No palpitations.   No chest pain with increased activity or exertion.  No increased shortness of breath.  No cough or congestion.   No nausea or vomiting.  No acid reflux.  No abdominal pain or cramping.  No bowel change, such as diarrhea, constipation, BRBPR or melana.  No urine change.   Taking her estrogen every other day. Discussed my desire to gradually wean her off.  Has been on for years.  States she feels better on the estrogen and would like to remain on the estrogen for now.  Blood pressures better.  Feels good.  Discharge as outlined.       Objective:   Physical Exam  Filed Vitals:   07/06/14 0928  BP: 130/70  Pulse: 75  Temp: 98.9 F (35.58 C)   73 year old female in no acute distress.   HEENT:  Nares- clear.  Oropharynx - without lesions. NECK:  Supple.  Nontender.  No audible bruit.  HEART:  Appears to be regular. LUNGS:  No crackles or wheezing audible.  Respirations even and unlabored.  RADIAL PULSE:  Equal bilaterally.    BREASTS:  No nipple discharge or nipple retraction present.  Could not appreciate any distinct nodules or axillary adenopathy.  ABDOMEN:  Soft, nontender.  Bowel sounds present and normal.  No audible abdominal bruit.  GU:  She declined.    EXTREMITIES:  No increased edema present.  DP pulses palpable and equal bilaterally.          Assessment & Plan:  CARDIOLOGY.  No chest pain or tightness now.  Feels good.  Follow.    ESTROGEN USAGE.  Discussed continued use with her.  She has been on - for years.  Desires to remain on the estrogen for now.  Taking the estrogen - 4 days/week.  Has a family history of breast cancer.  Discussed this with her as well.  Desires to remain on estrogen and progesterone.    PREVIOUS  ELEVATED BLOOD PRESSURE.  Blood pressure better now.  Follow.    FAMILY HISTORY OF BREAST CANCER.  Discussed genetic testing and my desire to have her off estrogen.    FAMILY HISTORY OF ANEURYSM.  Have discussed screening.    HEALTH MAINTENANCE.  Physical today.    Mammogram 07/24/13 - per her report - ok.   Schedule f/u mammogram.  Colonoscopy 2005.  Recommended follow up colonoscopy 10 years.  She declines f/u colonoscopy.   Declines vaccinations.  Bone density 12/10/11 normal.   I spent 25 minutes with the patient and more than 50% of the tie was spent in consultation regarding the above.

## 2014-07-11 NOTE — Assessment & Plan Note (Signed)
Documented in pt notes.  Continue supplements.  Follow.

## 2014-07-11 NOTE — Assessment & Plan Note (Signed)
Some ringing in her ears.  Some decreased hearing.  Discussed further w/up.  Wanted to refer to ENT.  She declines.  Will follow.

## 2014-07-11 NOTE — Assessment & Plan Note (Signed)
On simvastatin.  Low cholesterol diet and exercise.  Follow lipid panel and liver function.      

## 2014-07-11 NOTE — Assessment & Plan Note (Signed)
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  Seeing Dr Kellie Moor.

## 2014-07-11 NOTE — Assessment & Plan Note (Signed)
Previous uri.  Abnormal cxr.  Had CT chest.  Recommend further evaluation.  Dr Oliva Bustard reviewed.  Now being followed at the [facility].  F/u planned in 10/15.

## 2014-07-26 ENCOUNTER — Other Ambulatory Visit: Payer: Self-pay | Admitting: Internal Medicine

## 2014-08-12 ENCOUNTER — Encounter: Payer: Self-pay | Admitting: Internal Medicine

## 2014-08-17 ENCOUNTER — Ambulatory Visit: Payer: Self-pay | Admitting: Internal Medicine

## 2014-08-17 LAB — HM MAMMOGRAPHY: HM Mammogram: NEGATIVE

## 2014-08-18 ENCOUNTER — Encounter: Payer: Self-pay | Admitting: *Deleted

## 2014-09-14 ENCOUNTER — Ambulatory Visit: Payer: Self-pay | Admitting: Internal Medicine

## 2014-09-20 ENCOUNTER — Ambulatory Visit: Payer: Self-pay | Admitting: Oncology

## 2014-10-03 ENCOUNTER — Ambulatory Visit: Payer: Self-pay | Admitting: Oncology

## 2014-11-01 ENCOUNTER — Other Ambulatory Visit: Payer: Self-pay | Admitting: Internal Medicine

## 2014-11-02 ENCOUNTER — Other Ambulatory Visit (INDEPENDENT_AMBULATORY_CARE_PROVIDER_SITE_OTHER): Payer: Commercial Managed Care - HMO

## 2014-11-02 DIAGNOSIS — E7800 Pure hypercholesterolemia, unspecified: Secondary | ICD-10-CM

## 2014-11-02 DIAGNOSIS — N200 Calculus of kidney: Secondary | ICD-10-CM

## 2014-11-02 NOTE — Addendum Note (Signed)
Addended by: Johnsie Cancel on: 11/02/2014 09:03 AM   Modules accepted: Orders

## 2014-11-03 LAB — BASIC METABOLIC PANEL
BUN: 16 mg/dL (ref 6–23)
CO2: 28 meq/L (ref 19–32)
Calcium: 9.1 mg/dL (ref 8.4–10.5)
Chloride: 106 mEq/L (ref 96–112)
Creatinine, Ser: 0.9 mg/dL (ref 0.4–1.2)
GFR: 61.65 mL/min (ref >60.00)
GLUCOSE: 86 mg/dL (ref 70–99)
Potassium: 4.2 mEq/L (ref 3.5–5.1)
SODIUM: 138 meq/L (ref 135–145)

## 2014-11-03 LAB — LIPID PANEL
Cholesterol: 170 mg/dL (ref 0–200)
HDL: 40.4 mg/dL (ref >39.00)
LDL Cholesterol: 102 mg/dL — ABNORMAL HIGH (ref 0–99)
NONHDL: 129.6
Total CHOL/HDL Ratio: 4
Triglycerides: 137 mg/dL (ref 0.0–149.0)
VLDL: 27.4 mg/dL (ref 0.0–40.0)

## 2014-11-03 LAB — HEPATIC FUNCTION PANEL
ALT: 33 U/L (ref 0–35)
AST: 35 U/L (ref 0–37)
Albumin: 4 g/dL (ref 3.5–5.2)
Alkaline Phosphatase: 75 U/L (ref 39–117)
BILIRUBIN TOTAL: 0.8 mg/dL (ref 0.2–1.2)
Bilirubin, Direct: 0.1 mg/dL (ref 0.0–0.3)
Total Protein: 6.3 g/dL (ref 6.0–8.3)

## 2014-11-04 ENCOUNTER — Encounter: Payer: Self-pay | Admitting: Internal Medicine

## 2014-11-09 ENCOUNTER — Encounter: Payer: Self-pay | Admitting: Internal Medicine

## 2014-11-09 ENCOUNTER — Ambulatory Visit (INDEPENDENT_AMBULATORY_CARE_PROVIDER_SITE_OTHER): Payer: Commercial Managed Care - HMO | Admitting: Internal Medicine

## 2014-11-09 VITALS — BP 130/70 | HR 65 | Temp 98.2°F | Ht 65.0 in | Wt 161.8 lb

## 2014-11-09 DIAGNOSIS — D721 Eosinophilia, unspecified: Secondary | ICD-10-CM

## 2014-11-09 DIAGNOSIS — E7800 Pure hypercholesterolemia, unspecified: Secondary | ICD-10-CM

## 2014-11-09 DIAGNOSIS — N898 Other specified noninflammatory disorders of vagina: Secondary | ICD-10-CM

## 2014-11-09 DIAGNOSIS — R9389 Abnormal findings on diagnostic imaging of other specified body structures: Secondary | ICD-10-CM

## 2014-11-09 DIAGNOSIS — R938 Abnormal findings on diagnostic imaging of other specified body structures: Secondary | ICD-10-CM

## 2014-11-09 NOTE — Progress Notes (Signed)
Subjective:    Patient ID: Kristin Phelps, female    DOB: 09-May-1941, 73 y.o.   MRN: [id number]  HPI 73 year old female with past history of hypercholesterolemia and nephrolithiasis who comes in today for a scheduled follow up.  She states she is doing well.  Is active.  No cardiac symptoms with increased activity or exertion.   Breathing stable. No increased sob.  No increased cough.  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   Bowels stable.  She had reported noticing some vaginal discharge at times.  See previous notes for details.  No blood.  She is on estrogen and provera.  Only taking every other day.  We again discussed trying to taper off the medication.  She desires to remain on the estrogen and progesterone.  She still will notice some discharge.  We again discussed my desire for further w/up and evaluation of the discharge.  She desires no further w/up at this time.  Wants to monitor.  No abdominal pian or cramping.   Now being followed by Dr Oliva Bustard for her abnormal CT chest.  Just had f/u in 09/2014.  Stable.  Due f/u in 03/2015.       Past Medical History  Diagnosis Date  . Raynaud's phenomenon   . Hyperlipidemia   . Postmenopausal HRT (hormone replacement therapy)   . Nephrolithiasis   . Arthritis     Current Outpatient Prescriptions on File Prior to Visit  Medication Sig Dispense Refill  . Cholecalciferol (VITAMIN D-3) 1000 UNITS CAPS Take 1 capsule by mouth daily.    Marland Kitchen estropipate (OGEN) 0.75 MG tablet TAKE ONE-HALF TABLET BY MOUTH ONCE DAILY 90 tablet 0  . medroxyPROGESTERone (PROVERA) 2.5 MG tablet TAKE ONE TABLET BY MOUTH ONCE DAILY 90 tablet 0  . polycarbophil (FIBERCON) 625 MG tablet Take 625 mg by mouth daily.    . simvastatin (ZOCOR) 20 MG tablet TAKE ONE TABLET BY MOUTH ONCE DAILY 90 tablet 1   No current facility-administered medications on file prior to visit.    Review of Systems Patient denies any headache,  lightheadedness or dizziness.  No sinus or allergy symptoms.    No palpitations.   No chest pain with increased activity or exertion.  Has noticed some occasional chest pain.  No known trigger.  Resolves after 2-3 min.  No increased shortness of breath.  No cough or congestion.   No nausea or vomiting.  No acid reflux.  No abdominal pain or cramping.  No bowel change, such as diarrhea, constipation, BRBPR or melana.  No urine change.   Taking her estrogen every other day. Discussed my desire to gradually wean her off.  Has been on for years.  States she feels better on the estrogen and would like to remain on the estrogen for now.  Blood pressures better.  Feels good.  Discharge as outlined.       Objective:   Physical Exam  Filed Vitals:   11/09/14 0908  BP: 130/70  Pulse: 65  Temp: 98.2 F (23.58 C)   73 year old female in no acute distress.   HEENT:  Nares- clear.  Oropharynx - without lesions. NECK:  Supple.  Nontender.  No audible bruit.  HEART:  Appears to be regular. LUNGS:  No crackles or wheezing audible.  Respirations even and unlabored.  RADIAL PULSE:  Equal bilaterally.   ABDOMEN:  Soft, nontender.  Bowel sounds present  and normal.  No audible abdominal bruit.    EXTREMITIES:  No increased edema present.  DP pulses palpable and equal bilaterally.          Assessment & Plan:   Abnormal chest CT Seeing Dr Oliva Bustard.  Planning for f/u in 03/2014.  Stable.    Hypercholesterolemia Low cholesterol diet and exercise.  Cholesterol just checked with LDL 102.  On simvastatin.   Lab Results  Component Value Date   CHOL 170 11/02/2014   HDL 40.40 11/02/2014   LDLCALC 102* 11/02/2014   TRIG 137.0 11/02/2014   CHOLHDL 4 11/02/2014   3. Vaginal discharge See above.  Declines further w/up.    CHEST PAIN.  Discussed further w/up today.  She declines.  Wants to monitor.  Will notify me if she changes her mind.    ESTROGEN USAGE.  Discussed continued use with her.  She has been on - for  years.  Desires to remain on the estrogen for now.  Taking the estrogen - 4 days/week.  Has a family history of breast cancer.  Discussed this with her as well.  Desires to remain on estrogen and progesterone.    PREVIOUS ELEVATED BLOOD PRESSURE.  Blood pressure better now.  Follow.    FAMILY HISTORY OF BREAST CANCER.  Have discussed genetic testing and my desire to have her off estrogen.    FAMILY HISTORY OF ANEURYSM.  Have discussed screening.   HEALTH MAINTENANCE.  Physical 07/06/14.    Mammogram 08/17/14 - Birads I.  Colonoscopy 2005.  Recommended follow up colonoscopy 10 years.  She declines f/u colonoscopy.   Declines vaccinations.  Bone density 12/10/11 normal.   I spent 25 minutes with the patient and more than 50% of the tie was spent in consultation regarding the above.

## 2014-11-09 NOTE — Progress Notes (Signed)
Pre visit review using our clinic review tool, if applicable. No additional management support is needed unless otherwise documented below in the visit note. 

## 2014-11-14 ENCOUNTER — Encounter: Payer: Self-pay | Admitting: Internal Medicine

## 2014-11-14 DIAGNOSIS — N898 Other specified noninflammatory disorders of vagina: Secondary | ICD-10-CM | POA: Insufficient documentation

## 2015-03-21 ENCOUNTER — Other Ambulatory Visit: Payer: Commercial Managed Care - HMO

## 2015-03-22 ENCOUNTER — Ambulatory Visit: Admit: 2015-03-22 | Disposition: A | Discharge status: Home / Self Care | Payer: Self-pay | Admitting: Oncology

## 2015-03-22 ENCOUNTER — Other Ambulatory Visit (INDEPENDENT_AMBULATORY_CARE_PROVIDER_SITE_OTHER): Payer: Commercial Managed Care - HMO

## 2015-03-22 DIAGNOSIS — E7800 Pure hypercholesterolemia, unspecified: Secondary | ICD-10-CM

## 2015-03-22 DIAGNOSIS — D721 Eosinophilia, unspecified: Secondary | ICD-10-CM

## 2015-03-22 DIAGNOSIS — R911 Solitary pulmonary nodule: Secondary | ICD-10-CM | POA: Diagnosis not present

## 2015-03-22 DIAGNOSIS — R938 Abnormal findings on diagnostic imaging of other specified body structures: Secondary | ICD-10-CM | POA: Diagnosis not present

## 2015-03-22 DIAGNOSIS — R9389 Abnormal findings on diagnostic imaging of other specified body structures: Secondary | ICD-10-CM

## 2015-03-22 DIAGNOSIS — R590 Localized enlarged lymph nodes: Secondary | ICD-10-CM | POA: Diagnosis not present

## 2015-03-22 LAB — CBC WITH DIFFERENTIAL/PLATELET
Basophils Absolute: 0.1 10*3/uL (ref 0.0–0.1)
Basophils Relative: 0.9 % (ref 0.0–3.0)
EOS PCT: 6.8 % — AB (ref 0.0–5.0)
Eosinophils Absolute: 0.4 10*3/uL (ref 0.0–0.7)
HEMATOCRIT: 37.5 % (ref 36.0–46.0)
Hemoglobin: 12.8 g/dL (ref 12.0–15.0)
LYMPHS ABS: 1.1 10*3/uL (ref 0.7–4.0)
Lymphocytes Relative: 18.9 % (ref 12.0–46.0)
MCHC: 34.3 g/dL (ref 30.0–36.0)
MCV: 87.3 fl (ref 78.0–100.0)
MONO ABS: 0.4 10*3/uL (ref 0.1–1.0)
Monocytes Relative: 7.2 % (ref 3.0–12.0)
Neutro Abs: 4 10*3/uL (ref 1.4–7.7)
Neutrophils Relative %: 66.2 % (ref 43.0–77.0)
Platelets: 252 10*3/uL (ref 150.0–400.0)
RBC: 4.29 Mil/uL (ref 3.87–5.11)
RDW: 12.7 % (ref 11.5–15.5)
WBC: 6.1 10*3/uL (ref 4.0–10.5)

## 2015-03-22 LAB — BASIC METABOLIC PANEL
BUN: 14 mg/dL (ref 6–23)
CALCIUM: 9.2 mg/dL (ref 8.4–10.5)
CO2: 30 meq/L (ref 19–32)
CREATININE: 1.08 mg/dL (ref 0.40–1.20)
Chloride: 104 mEq/L (ref 96–112)
GFR: 52.47 mL/min — AB (ref >60.00)
GLUCOSE: 80 mg/dL (ref 70–99)
Potassium: 4 mEq/L (ref 3.5–5.1)
Sodium: 139 mEq/L (ref 135–145)

## 2015-03-22 LAB — HEPATIC FUNCTION PANEL
ALT: 19 U/L (ref 0–35)
AST: 24 U/L (ref 0–37)
Albumin: 3.8 g/dL (ref 3.5–5.2)
Alkaline Phosphatase: 68 U/L (ref 39–117)
Bilirubin, Direct: 0.1 mg/dL (ref 0.0–0.3)
TOTAL PROTEIN: 6.3 g/dL (ref 6.0–8.3)
Total Bilirubin: 0.6 mg/dL (ref 0.2–1.2)

## 2015-03-22 LAB — LIPID PANEL
Cholesterol: 153 mg/dL (ref 0–200)
HDL: 42.9 mg/dL (ref >39.00)
LDL CALC: 88 mg/dL (ref 0–99)
NonHDL: 110.1
TRIGLYCERIDES: 109 mg/dL (ref 0.0–149.0)
Total CHOL/HDL Ratio: 4
VLDL: 21.8 mg/dL (ref 0.0–40.0)

## 2015-03-23 ENCOUNTER — Encounter: Payer: Self-pay | Admitting: Internal Medicine

## 2015-03-24 ENCOUNTER — Ambulatory Visit
Admit: 2015-03-24 | Disposition: A | Discharge status: Home / Self Care | Payer: Self-pay | Attending: Oncology | Admitting: Oncology

## 2015-03-24 DIAGNOSIS — R591 Generalized enlarged lymph nodes: Secondary | ICD-10-CM | POA: Diagnosis not present

## 2015-03-25 ENCOUNTER — Ambulatory Visit: Payer: Commercial Managed Care - HMO | Admitting: Internal Medicine

## 2015-03-26 ENCOUNTER — Encounter: Payer: Self-pay | Admitting: Internal Medicine

## 2015-03-28 DIAGNOSIS — R591 Generalized enlarged lymph nodes: Secondary | ICD-10-CM | POA: Diagnosis not present

## 2015-03-29 ENCOUNTER — Ambulatory Visit (INDEPENDENT_AMBULATORY_CARE_PROVIDER_SITE_OTHER): Payer: Commercial Managed Care - HMO | Admitting: Internal Medicine

## 2015-03-29 ENCOUNTER — Encounter: Payer: Self-pay | Admitting: Internal Medicine

## 2015-03-29 VITALS — BP 128/70 | HR 64 | Temp 98.2°F | Ht 65.0 in | Wt 160.0 lb

## 2015-03-29 DIAGNOSIS — R9389 Abnormal findings on diagnostic imaging of other specified body structures: Secondary | ICD-10-CM

## 2015-03-29 DIAGNOSIS — C4490 Unspecified malignant neoplasm of skin, unspecified: Secondary | ICD-10-CM

## 2015-03-29 DIAGNOSIS — E559 Vitamin D deficiency, unspecified: Secondary | ICD-10-CM

## 2015-03-29 DIAGNOSIS — Z0000 Encounter for general adult medical examination without abnormal findings: Secondary | ICD-10-CM | POA: Diagnosis not present

## 2015-03-29 DIAGNOSIS — E7800 Pure hypercholesterolemia, unspecified: Secondary | ICD-10-CM

## 2015-03-29 DIAGNOSIS — N898 Other specified noninflammatory disorders of vagina: Secondary | ICD-10-CM

## 2015-03-29 DIAGNOSIS — R938 Abnormal findings on diagnostic imaging of other specified body structures: Secondary | ICD-10-CM

## 2015-03-29 MED ORDER — MEDROXYPROGESTERONE ACETATE 2.5 MG PO TABS: 2.5000 mg | ORAL_TABLET | Freq: Every day | ORAL | Status: DC

## 2015-03-29 NOTE — Progress Notes (Signed)
Patient ID: Kristin Phelps, female   DOB: Feb 06, 1941, 74 y.o.   MRN: [id number]   Subjective:    Patient ID: Kristin Phelps, female    DOB: [birth year], 74 y.o.   MRN: [id number]  HPI  Patient here for a scheduled follow up.  She just saw Dr Oliva Bustard.  Chest CT - lesion smaller.  Recommended f/u in one year.  No increased cough or congestion.  No sob.  Eating and drinking well.  Bowels doing well.  Still with some discharge.  On estrogen and progesterone.  Wants to remain on these medications.  Feels better on them.  Does not want to stop.     Past Medical History  Diagnosis Date  . Raynaud's phenomenon   . Hyperlipidemia   . Postmenopausal HRT (hormone replacement therapy)   . Nephrolithiasis   . Arthritis     Outpatient Encounter Prescriptions as of 03/29/2015  Medication Sig  . Cholecalciferol (VITAMIN D-3) 1000 UNITS CAPS Take 1 capsule by mouth daily.  Marland Kitchen estropipate (OGEN) 0.75 MG tablet TAKE ONE-HALF TABLET BY MOUTH ONCE DAILY  . medroxyPROGESTERone (PROVERA) 2.5 MG tablet Take 1 tablet (2.5 mg total) by mouth daily.  . polycarbophil (FIBERCON) 625 MG tablet Take 625 mg by mouth daily.  . simvastatin (ZOCOR) 20 MG tablet TAKE ONE TABLET BY MOUTH ONCE DAILY  . [DISCONTINUED] medroxyPROGESTERone (PROVERA) 2.5 MG tablet TAKE ONE TABLET BY MOUTH ONCE DAILY    Review of Systems  Constitutional: Negative for appetite change and unexpected weight change.  HENT: Negative for sinus pressure.        No significant congestion.  Some allergy symptoms.   No severe.    Respiratory: Negative for cough, chest tightness and shortness of breath.   Cardiovascular: Negative for chest pain, palpitations and leg swelling.  Gastrointestinal: Negative for nausea, vomiting, abdominal pain and diarrhea.  Genitourinary: Positive for vaginal discharge (persistent intermittent discharge).  Neurological: Negative for dizziness, light-headedness and headaches.  Psychiatric/Behavioral: Negative for  dysphoric mood and agitation.       Objective:     Blood pressure recheck:  134/78  Physical Exam  Constitutional: She appears well-developed and well-nourished. No distress.  HENT:  Nose: Nose normal.  Mouth/Throat: Oropharynx is clear and moist.  Neck: Neck supple. No thyromegaly present.  Cardiovascular: Normal rate and regular rhythm.   Pulmonary/Chest: Breath sounds normal. No respiratory distress. She has no wheezes.  Abdominal: Soft. Bowel sounds are normal. There is no tenderness.  Musculoskeletal: She exhibits no edema or tenderness.  Lymphadenopathy:    She has no cervical adenopathy.  Skin: No rash noted. No erythema.  Psychiatric: She has a normal mood and affect. Her behavior is normal.    BP 128/70 mmHg  Pulse 64  Temp(Src) 98.2 F (36.8 C) (Oral)  Ht 5\' 5"  (1.651 m)  Wt 160 lb (72.576 kg)  BMI 26.63 kg/m2  SpO2 97% Wt Readings from Last 3 Encounters:  03/29/15 160 lb (72.576 kg)  11/09/14 161 lb 12 oz (73.369 kg)  07/06/14 158 lb 8 oz (71.895 kg)     Lab Results  Component Value Date   WBC 6.1 03/22/2015   HGB 12.8 03/22/2015   HCT 37.5 03/22/2015   PLT 252.0 03/22/2015   GLUCOSE 80 03/22/2015   CHOL 153 03/22/2015   TRIG 109.0 03/22/2015   HDL 42.90 03/22/2015   LDLCALC 88 03/22/2015   ALT 19 03/22/2015   AST 24 03/22/2015   NA 139 03/22/2015   K 4.0  03/22/2015   CL 104 03/22/2015   CREATININE 1.08 03/22/2015   BUN 14 03/22/2015   CO2 30 03/22/2015   TSH 1.59 04/09/2014       Assessment & Plan:   Problem List Items Addressed This Visit    Abnormal chest CT    Sees Dr Oliva Bustard.  Recent CT chest - lesion smaller.  Plan for f/u CT and f/u with Dr Oliva Bustard in one year.        Relevant Orders   TSH   Basic metabolic panel   Health care maintenance    Physical 07/06/14.  Mammogram 08/17/14 - Birads I.  Colonoscopy 2005.  Recommended f/u in 10 years.  She declines.  Bone density 12/10/11 - normal.        Hypercholesterolemia    Low  cholesterol diet an exercise.  On simvastatin.  Follow lipid panel and liver function tests.   Lab Results  Component Value Date   CHOL 153 03/22/2015   HDL 42.90 03/22/2015   LDLCALC 88 03/22/2015   TRIG 109.0 03/22/2015   CHOLHDL 4 03/22/2015        Relevant Orders   Lipid panel   Hepatic function panel   Skin cancer    Followed by Dr Kellie Moor.        Vaginal discharge - Primary    Persistent.  On estroten/progesterone.  Discussed at length with her today.  Refer to gyn for evaluation.  She wants to hold to go for one month.        Relevant Orders   Ambulatory referral to Gynecology   Vitamin D deficiency    Documented in pt notes.  Vitamin D level 04/2014 - wnl.        Relevant Orders   Vit D  25 hydroxy (rtn osteoporosis monitoring)     I spent 25 minutes with the patient and more than 50% of the time was spent in consultation regarding the above.     Einar Pheasant, MD

## 2015-03-29 NOTE — Assessment & Plan Note (Signed)
Persistent.  On estroten/progesterone.  Discussed at length with her today.  Refer to gyn for evaluation.  She wants to hold to go for one month.

## 2015-03-29 NOTE — Progress Notes (Signed)
Pre visit review using our clinic review tool, if applicable. No additional management support is needed unless otherwise documented below in the visit note. 

## 2015-03-29 NOTE — Assessment & Plan Note (Signed)
Physical 07/06/14.  Mammogram 08/17/14 - Birads I.  Colonoscopy 2005.  Recommended f/u in 10 years.  She declines.  Bone density 12/10/11 - normal.

## 2015-03-31 ENCOUNTER — Encounter: Payer: Self-pay | Admitting: Internal Medicine

## 2015-03-31 NOTE — Assessment & Plan Note (Signed)
Sees Dr Oliva Bustard.  Recent CT chest - lesion smaller.  Plan for f/u CT and f/u with Dr Oliva Bustard in one year.

## 2015-03-31 NOTE — Assessment & Plan Note (Signed)
Low cholesterol diet an exercise.  On simvastatin.  Follow lipid panel and liver function tests.   Lab Results  Component Value Date   CHOL 153 03/22/2015   HDL 42.90 03/22/2015   LDLCALC 88 03/22/2015   TRIG 109.0 03/22/2015   CHOLHDL 4 03/22/2015

## 2015-03-31 NOTE — Assessment & Plan Note (Signed)
Documented in pt notes.  Vitamin D level 04/2014 - wnl.

## 2015-03-31 NOTE — Assessment & Plan Note (Signed)
Followed by Dr Isenstein.  

## 2015-04-21 DIAGNOSIS — N898 Other specified noninflammatory disorders of vagina: Secondary | ICD-10-CM | POA: Diagnosis not present

## 2015-04-21 DIAGNOSIS — Z79890 Hormone replacement therapy (postmenopausal): Secondary | ICD-10-CM | POA: Diagnosis not present

## 2015-05-02 ENCOUNTER — Other Ambulatory Visit: Payer: Self-pay | Admitting: Internal Medicine

## 2015-05-24 ENCOUNTER — Encounter: Payer: Self-pay | Admitting: Internal Medicine

## 2015-07-19 ENCOUNTER — Other Ambulatory Visit (INDEPENDENT_AMBULATORY_CARE_PROVIDER_SITE_OTHER): Payer: Commercial Managed Care - HMO

## 2015-07-19 ENCOUNTER — Encounter: Payer: Self-pay | Admitting: Internal Medicine

## 2015-07-19 DIAGNOSIS — E559 Vitamin D deficiency, unspecified: Secondary | ICD-10-CM | POA: Diagnosis not present

## 2015-07-19 DIAGNOSIS — E7800 Pure hypercholesterolemia, unspecified: Secondary | ICD-10-CM

## 2015-07-19 DIAGNOSIS — R9389 Abnormal findings on diagnostic imaging of other specified body structures: Secondary | ICD-10-CM

## 2015-07-19 DIAGNOSIS — R938 Abnormal findings on diagnostic imaging of other specified body structures: Secondary | ICD-10-CM

## 2015-07-19 LAB — LIPID PANEL
Cholesterol: 158 mg/dL (ref 0–200)
HDL: 44.8 mg/dL (ref >39.00)
LDL CALC: 90 mg/dL (ref 0–99)
NonHDL: 113.2
TRIGLYCERIDES: 114 mg/dL (ref 0.0–149.0)
Total CHOL/HDL Ratio: 4
VLDL: 22.8 mg/dL (ref 0.0–40.0)

## 2015-07-19 LAB — BASIC METABOLIC PANEL
BUN: 14 mg/dL (ref 6–23)
CO2: 30 meq/L (ref 19–32)
Calcium: 9.2 mg/dL (ref 8.4–10.5)
Chloride: 105 mEq/L (ref 96–112)
Creatinine, Ser: 0.99 mg/dL (ref 0.40–1.20)
GFR: 57.96 mL/min — ABNORMAL LOW (ref >60.00)
Glucose, Bld: 90 mg/dL (ref 70–99)
Potassium: 4.1 mEq/L (ref 3.5–5.1)
SODIUM: 141 meq/L (ref 135–145)

## 2015-07-19 LAB — HEPATIC FUNCTION PANEL
ALT: 18 U/L (ref 0–35)
AST: 21 U/L (ref 0–37)
Albumin: 3.9 g/dL (ref 3.5–5.2)
Alkaline Phosphatase: 60 U/L (ref 39–117)
BILIRUBIN DIRECT: 0.1 mg/dL (ref 0.0–0.3)
Total Bilirubin: 0.5 mg/dL (ref 0.2–1.2)
Total Protein: 6.6 g/dL (ref 6.0–8.3)

## 2015-07-19 LAB — VITAMIN D 25 HYDROXY (VIT D DEFICIENCY, FRACTURES): VITD: 42.13 ng/mL (ref 30.00–100.00)

## 2015-07-19 LAB — TSH: TSH: 3.25 u[IU]/mL (ref 0.35–4.50)

## 2015-07-21 ENCOUNTER — Ambulatory Visit (INDEPENDENT_AMBULATORY_CARE_PROVIDER_SITE_OTHER): Payer: Commercial Managed Care - HMO | Admitting: Internal Medicine

## 2015-07-21 VITALS — BP 126/74 | HR 79 | Temp 98.7°F | Ht 65.0 in | Wt 162.0 lb

## 2015-07-21 DIAGNOSIS — E7800 Pure hypercholesterolemia, unspecified: Secondary | ICD-10-CM

## 2015-07-21 DIAGNOSIS — Z0000 Encounter for general adult medical examination without abnormal findings: Secondary | ICD-10-CM

## 2015-07-21 DIAGNOSIS — R938 Abnormal findings on diagnostic imaging of other specified body structures: Secondary | ICD-10-CM

## 2015-07-21 DIAGNOSIS — R9389 Abnormal findings on diagnostic imaging of other specified body structures: Secondary | ICD-10-CM

## 2015-07-21 DIAGNOSIS — Z1239 Encounter for other screening for malignant neoplasm of breast: Secondary | ICD-10-CM

## 2015-07-21 MED ORDER — SIMVASTATIN 20 MG PO TABS: 20.0000 mg | ORAL_TABLET | Freq: Every day | ORAL | Status: DC

## 2015-07-21 NOTE — Progress Notes (Signed)
Pre visit review using our clinic review tool, if applicable. No additional management support is needed unless otherwise documented below in the visit note. 

## 2015-07-21 NOTE — Progress Notes (Signed)
Patient ID: Kristin Phelps, female   DOB: 1941-07-18, 74 y.o.   MRN: [id number]   Subjective:    Patient ID: Kristin Phelps, female    DOB: Sep 19, 1941, 74 y.o.   MRN: [id number]  HPI  Patient here to follow up on her current medical issues as well as for a complete physical exam. She stays active.  No cardiac symptoms with increased activity or exertion.  No sob.  Eating and drinking well.  Dr Oliva Bustard is following for abnormal cxr/CT.  Last checked 03/2015 - per report - ok.  No cough or congestion. No acid reflux reported.  Bowels stable.  Discussed colonoscopy.  Overdue.     Past Medical History  Diagnosis Date  . Raynaud's phenomenon   . Hyperlipidemia   . Postmenopausal HRT (hormone replacement therapy)   . Nephrolithiasis   . Arthritis    Past Surgical History  Procedure Laterality Date  . Tonsillectomy    . Lithotripsy    . Anal fissure surgery  9/06   Family History  Problem Relation Age of Onset  . Breast cancer Mother     died age 66  . Heart disease Father     s/p CABG  . Aneurysm Father   . Breast cancer      maternal niece  . Colon cancer Neg Hx    Social History   Social History  . Marital Status: Married    Spouse Name: N/A  . Number of Children: 2  . Years of Education: N/A   Social History Main Topics  . Smoking status: Never Smoker   . Smokeless tobacco: Never Used  . Alcohol Use: No  . Drug Use: No  . Sexual Activity: Not Asked   Other Topics Concern  . None   Social History Narrative    Outpatient Encounter Prescriptions as of 07/21/2015  Medication Sig  . Cholecalciferol (VITAMIN D-3) 1000 UNITS CAPS Take 1 capsule by mouth daily.  Marland Kitchen estropipate (OGEN) 0.75 MG tablet TAKE ONE-HALF TABLET BY MOUTH ONCE DAILY  . medroxyPROGESTERone (PROVERA) 2.5 MG tablet Take 1 tablet (2.5 mg total) by mouth daily.  . polycarbophil (FIBERCON) 625 MG tablet Take 625 mg by mouth daily.  . simvastatin (ZOCOR) 20 MG tablet Take 1 tablet (20 mg total) by  mouth daily.  . [DISCONTINUED] simvastatin (ZOCOR) 20 MG tablet TAKE ONE TABLET BY MOUTH ONCE DAILY   No facility-administered encounter medications on file as of 07/21/2015.    Review of Systems  Constitutional: Negative for appetite change and unexpected weight change.  HENT: Negative for congestion and sinus pressure.   Eyes: Negative for pain and visual disturbance.  Respiratory: Negative for cough, chest tightness and shortness of breath.   Cardiovascular: Negative for chest pain, palpitations and leg swelling.  Gastrointestinal: Negative for nausea, vomiting, abdominal pain and diarrhea.  Genitourinary: Negative for dysuria and difficulty urinating.  Musculoskeletal: Negative for back pain and joint swelling.  Skin: Negative for color change and rash.  Neurological: Negative for dizziness, light-headedness and headaches.  Hematological: Negative for adenopathy. Does not bruise/bleed easily.  Psychiatric/Behavioral: Negative for dysphoric mood and agitation.       Objective:     Blood pressure recheck:  122/64  Physical Exam  Constitutional: She is oriented to person, place, and time. She appears well-developed and well-nourished. No distress.  HENT:  Nose: Nose normal.  Mouth/Throat: Oropharynx is clear and moist.  Eyes: Right eye exhibits no discharge. Left eye exhibits no discharge. No scleral  icterus.  Neck: Neck supple. No thyromegaly present.  Cardiovascular: Normal rate and regular rhythm.   Pulmonary/Chest: Breath sounds normal. No accessory muscle usage. No tachypnea. No respiratory distress. She has no decreased breath sounds. She has no wheezes. She has no rhonchi. Right breast exhibits no inverted nipple, no mass, no nipple discharge and no tenderness (no axillary adenopathy). Left breast exhibits no inverted nipple, no mass, no nipple discharge and no tenderness (no axilarry adenopathy).  Abdominal: Soft. Bowel sounds are normal. There is no tenderness.    Musculoskeletal: She exhibits no edema or tenderness.  Lymphadenopathy:    She has no cervical adenopathy.  Neurological: She is alert and oriented to person, place, and time.  Skin: Skin is warm. No rash noted. No erythema.  Psychiatric: She has a normal mood and affect. Her behavior is normal.    BP 126/74 mmHg  Pulse 79  Temp(Src) 98.7 F (37.1 C) (Oral)  Ht 5\' 5"  (1.651 m)  Wt 162 lb (73.483 kg)  BMI 26.96 kg/m2  SpO2 96% Wt Readings from Last 3 Encounters:  07/21/15 162 lb (73.483 kg)  03/29/15 160 lb (72.576 kg)  11/09/14 161 lb 12 oz (73.369 kg)     Lab Results  Component Value Date   WBC 6.1 03/22/2015   HGB 12.8 03/22/2015   HCT 37.5 03/22/2015   PLT 252.0 03/22/2015   GLUCOSE 90 07/19/2015   CHOL 158 07/19/2015   TRIG 114.0 07/19/2015   HDL 44.80 07/19/2015   LDLCALC 90 07/19/2015   ALT 18 07/19/2015   AST 21 07/19/2015   NA 141 07/19/2015   K 4.1 07/19/2015   CL 105 07/19/2015   CREATININE 0.99 07/19/2015   BUN 14 07/19/2015   CO2 30 07/19/2015   TSH 3.25 07/19/2015       Assessment & Plan:   Problem List Items Addressed This Visit    Abnormal chest CT    Being followed by Dr Oliva Bustard.  Last evaluated 03/2015.  States is on a yearly schedule.  Follow.        Health care maintenance - Primary    Physical today 07/21/15.  Mammogram 08/17/14 - Birads I.  Schedule f/u mammogram.  Colonoscopy 2005.  Recommended f/u colonoscopy in 10 years.  Discussed with her today.  She will notify me when agreeable.  Bone density - 12/10/11 - normal.        Hypercholesterolemia    On simvastatin.  Low cholesterol diet and exercise.  Follow lipid panel and liver function tests.        Relevant Medications   simvastatin (ZOCOR) 20 MG tablet   Other Relevant Orders   Hepatic function panel   Lipid panel   Basic metabolic panel    Other Visit Diagnoses    Breast cancer screening        Relevant Orders    MM DIGITAL SCREENING BILATERAL        Einar Pheasant,  MD

## 2015-07-26 ENCOUNTER — Encounter: Payer: Self-pay | Admitting: Internal Medicine

## 2015-07-26 NOTE — Assessment & Plan Note (Signed)
Being followed by Dr Oliva Bustard.  Last evaluated 03/2015.  States is on a yearly schedule.  Follow.

## 2015-07-26 NOTE — Assessment & Plan Note (Signed)
Physical today 07/21/15.  Mammogram 08/17/14 - Birads I.  Schedule f/u mammogram.  Colonoscopy 2005.  Recommended f/u colonoscopy in 10 years.  Discussed with her today.  She will notify me when agreeable.  Bone density - 12/10/11 - normal.

## 2015-07-26 NOTE — Assessment & Plan Note (Signed)
On simvastatin.  Low cholesterol diet and exercise.  Follow lipid panel and liver function tests.   

## 2015-08-23 ENCOUNTER — Ambulatory Visit
Admission: RE | Admit: 2015-08-23 | Discharge: 2015-08-23 | Disposition: A | Discharge status: Home / Self Care | Payer: Commercial Managed Care - HMO | Source: Ambulatory Visit | Attending: Internal Medicine | Admitting: Internal Medicine

## 2015-08-23 ENCOUNTER — Other Ambulatory Visit: Payer: Self-pay | Admitting: Internal Medicine

## 2015-08-23 DIAGNOSIS — Z1231 Encounter for screening mammogram for malignant neoplasm of breast: Secondary | ICD-10-CM | POA: Diagnosis not present

## 2015-08-23 DIAGNOSIS — Z1239 Encounter for other screening for malignant neoplasm of breast: Secondary | ICD-10-CM

## 2015-08-23 HISTORY — DX: Malignant (primary) neoplasm, unspecified: C80.1

## 2015-09-20 ENCOUNTER — Other Ambulatory Visit: Payer: Self-pay | Admitting: Internal Medicine

## 2015-11-29 ENCOUNTER — Encounter: Payer: Self-pay | Admitting: Internal Medicine

## 2015-12-09 DIAGNOSIS — H5203 Hypermetropia, bilateral: Secondary | ICD-10-CM | POA: Diagnosis not present

## 2015-12-09 DIAGNOSIS — H5210 Myopia, unspecified eye: Secondary | ICD-10-CM | POA: Diagnosis not present

## 2015-12-22 ENCOUNTER — Telehealth: Payer: Self-pay | Admitting: Internal Medicine

## 2015-12-22 NOTE — Telephone Encounter (Signed)
Left msg to call office to schedule AWV/msn °

## 2016-01-18 ENCOUNTER — Other Ambulatory Visit: Payer: Self-pay | Admitting: Internal Medicine

## 2016-01-24 ENCOUNTER — Other Ambulatory Visit (INDEPENDENT_AMBULATORY_CARE_PROVIDER_SITE_OTHER): Payer: Commercial Managed Care - HMO

## 2016-01-24 DIAGNOSIS — E7800 Pure hypercholesterolemia, unspecified: Secondary | ICD-10-CM

## 2016-01-24 LAB — BASIC METABOLIC PANEL
BUN: 12 mg/dL (ref 6–23)
CHLORIDE: 103 meq/L (ref 96–112)
CO2: 30 mEq/L (ref 19–32)
CREATININE: 1.02 mg/dL (ref 0.40–1.20)
Calcium: 9.3 mg/dL (ref 8.4–10.5)
GFR: 55.92 mL/min — ABNORMAL LOW (ref >60.00)
GLUCOSE: 84 mg/dL (ref 70–99)
POTASSIUM: 4.3 meq/L (ref 3.5–5.1)
Sodium: 138 mEq/L (ref 135–145)

## 2016-01-24 LAB — LIPID PANEL
CHOLESTEROL: 165 mg/dL (ref 0–200)
HDL: 48.3 mg/dL (ref >39.00)
LDL CALC: 93 mg/dL (ref 0–99)
NonHDL: 116.24
TRIGLYCERIDES: 118 mg/dL (ref 0.0–149.0)
Total CHOL/HDL Ratio: 3
VLDL: 23.6 mg/dL (ref 0.0–40.0)

## 2016-01-24 LAB — HEPATIC FUNCTION PANEL
ALT: 20 U/L (ref 0–35)
AST: 23 U/L (ref 0–37)
Albumin: 4.1 g/dL (ref 3.5–5.2)
Alkaline Phosphatase: 62 U/L (ref 39–117)
BILIRUBIN TOTAL: 0.6 mg/dL (ref 0.2–1.2)
Bilirubin, Direct: 0.1 mg/dL (ref 0.0–0.3)
TOTAL PROTEIN: 6.6 g/dL (ref 6.0–8.3)

## 2016-01-25 ENCOUNTER — Encounter: Payer: Self-pay | Admitting: Internal Medicine

## 2016-01-26 ENCOUNTER — Ambulatory Visit: Payer: Commercial Managed Care - HMO | Admitting: Internal Medicine

## 2016-01-27 ENCOUNTER — Ambulatory Visit: Payer: Commercial Managed Care - HMO | Admitting: Internal Medicine

## 2016-01-31 NOTE — Telephone Encounter (Signed)
Unread mychart message mailed to patient 

## 2016-02-17 ENCOUNTER — Ambulatory Visit (INDEPENDENT_AMBULATORY_CARE_PROVIDER_SITE_OTHER): Payer: Commercial Managed Care - HMO | Admitting: Internal Medicine

## 2016-02-17 ENCOUNTER — Encounter: Payer: Self-pay | Admitting: Internal Medicine

## 2016-02-17 VITALS — BP 136/70 | HR 71 | Temp 98.2°F | Resp 12 | Ht 65.0 in | Wt 159.8 lb

## 2016-02-17 DIAGNOSIS — E559 Vitamin D deficiency, unspecified: Secondary | ICD-10-CM | POA: Diagnosis not present

## 2016-02-17 DIAGNOSIS — E7800 Pure hypercholesterolemia, unspecified: Secondary | ICD-10-CM

## 2016-02-17 DIAGNOSIS — R938 Abnormal findings on diagnostic imaging of other specified body structures: Secondary | ICD-10-CM | POA: Diagnosis not present

## 2016-02-17 DIAGNOSIS — R9389 Abnormal findings on diagnostic imaging of other specified body structures: Secondary | ICD-10-CM

## 2016-02-17 NOTE — Progress Notes (Signed)
Pre visit review using our clinic review tool, if applicable. No additional management support is needed unless otherwise documented below in the visit note. 

## 2016-02-17 NOTE — Progress Notes (Signed)
Patient ID: Kristin Phelps, female   DOB: Dec 15, 1940, 75 y.o.   MRN: [id number]   Subjective:    Patient ID: Kristin Phelps, female    DOB: 01-Jan-1941, 75 y.o.   MRN: [id number]  HPI  Patient here for a scheduled follow up.  She is staying active.  No cardiac symptoms with increased activity or exertion.  No sob.  No increased cough or congestion.  No acid reflux.  No abdominal pain or cramping.  Bowels stable.  No acid reflux.  Discussed f/u with Dr Oliva Bustard. She is agreeable for f/u in 04/2016.  Wants to get through tax season first.  Overall feels good.    Past Medical History  Diagnosis Date  . Raynaud's phenomenon   . Hyperlipidemia   . Postmenopausal HRT (hormone replacement therapy)   . Nephrolithiasis   . Arthritis   . Cancer ([place])     basal cell   Past Surgical History  Procedure Laterality Date  . Tonsillectomy    . Lithotripsy    . Anal fissure surgery  9/06   Family History  Problem Relation Age of Onset  . Breast cancer Mother     died age 51  . Heart disease Father     s/p CABG  . Aneurysm Father   . Breast cancer      maternal niece  . Colon cancer Neg Hx    Social History   Social History  . Marital Status: Married    Spouse Name: N/A  . Number of Children: 2  . Years of Education: N/A   Social History Main Topics  . Smoking status: Never Smoker   . Smokeless tobacco: Never Used  . Alcohol Use: No  . Drug Use: No  . Sexual Activity: Not Asked   Other Topics Concern  . None   Social History Narrative    Outpatient Encounter Prescriptions as of 02/17/2016  Medication Sig  . Cholecalciferol (VITAMIN D-3) 1000 UNITS CAPS Take 1 capsule by mouth daily.  Marland Kitchen estropipate (OGEN) 0.75 MG tablet TAKE ONE-HALF TABLET BY MOUTH ONCE DAILY  . medroxyPROGESTERone (PROVERA) 2.5 MG tablet TAKE ONE TABLET BY MOUTH ONCE DAILY  . polycarbophil (FIBERCON) 625 MG tablet Take 625 mg by mouth daily.  . simvastatin (ZOCOR) 20 MG tablet TAKE ONE TABLET BY MOUTH  ONCE DAILY   No facility-administered encounter medications on file as of 02/17/2016.    Review of Systems  Constitutional: Negative for appetite change and unexpected weight change.  HENT: Negative for congestion and sinus pressure.   Respiratory: Negative for cough, chest tightness and shortness of breath.   Cardiovascular: Negative for chest pain, palpitations and leg swelling.  Gastrointestinal: Negative for nausea, vomiting, abdominal pain and diarrhea.  Genitourinary: Negative for dysuria and difficulty urinating.  Musculoskeletal: Negative for back pain and joint swelling.  Skin: Negative for color change and rash.  Neurological: Negative for dizziness, light-headedness and headaches.  Psychiatric/Behavioral: Negative for dysphoric mood and agitation.       Objective:    Physical Exam  Constitutional: She appears well-developed and well-nourished. No distress.  HENT:  Nose: Nose normal.  Mouth/Throat: Oropharynx is clear and moist.  Neck: Neck supple. No thyromegaly present.  Cardiovascular: Normal rate and regular rhythm.   Pulmonary/Chest: Breath sounds normal. No respiratory distress. She has no wheezes.  Abdominal: Soft. Bowel sounds are normal. There is no tenderness.  Musculoskeletal: She exhibits no edema or tenderness.  Lymphadenopathy:    She has no cervical adenopathy.  Skin: No rash noted. No erythema.  Psychiatric: She has a normal mood and affect. Her behavior is normal.    BP 136/70 mmHg  Pulse 71  Temp(Src) 98.2 F (36.8 C) (Oral)  Resp 12  Ht 5\' 5"  (1.651 m)  Wt 159 lb 12.8 oz (72.485 kg)  BMI 26.59 kg/m2  SpO2 99% Wt Readings from Last 3 Encounters:  02/17/16 159 lb 12.8 oz (72.485 kg)  07/21/15 162 lb (73.483 kg)  03/29/15 160 lb (72.576 kg)     Lab Results  Component Value Date   WBC 6.1 03/22/2015   HGB 12.8 03/22/2015   HCT 37.5 03/22/2015   PLT 252.0 03/22/2015   GLUCOSE 84 01/24/2016   CHOL 165 01/24/2016   TRIG 118.0  01/24/2016   HDL 48.30 01/24/2016   LDLCALC 93 01/24/2016   ALT 20 01/24/2016   AST 23 01/24/2016   NA 138 01/24/2016   K 4.3 01/24/2016   CL 103 01/24/2016   CREATININE 1.02 01/24/2016   BUN 12 01/24/2016   CO2 30 01/24/2016   TSH 3.25 07/19/2015    Mm Screening Breast Tomo Bilateral  08/23/2015  CLINICAL DATA:  Screening. EXAM: DIGITAL SCREENING BILATERAL MAMMOGRAM WITH 3D TOMO WITH CAD COMPARISON:  Previous exam(s). ACR Breast Density Category b: There are scattered areas of fibroglandular density. FINDINGS: There are no findings suspicious for malignancy. Images were processed with CAD. IMPRESSION: No mammographic evidence of malignancy. A result letter of this screening mammogram will be mailed directly to the patient. RECOMMENDATION: Screening mammogram in one year. (Code:SM-B-01Y) BI-RADS CATEGORY  1: Negative. Electronically Signed   By: Margarette Canada M.D.   On: 08/23/2015 10:34       Assessment & Plan:   Problem List Items Addressed This Visit    Abnormal chest CT - Primary    Being followed by Dr Oliva Bustard.  Last evaluated 03/2015.  States recommended yearly f/u.  Wants scheduled in 04/2016.        Relevant Orders   Ambulatory referral to Oncology   CBC with Differential/Platelet   Hypercholesterolemia    Low cholesterol diet and exercise.  On simvastatin.  Lipid panel and liver function tests.        Relevant Orders   TSH   Lipid panel   Hepatic function panel   Basic metabolic panel   Vitamin D deficiency    Continue vitamin D supplements.        Relevant Orders   VITAMIN D 25 Hydroxy (Vit-D Deficiency, Fractures)       Einar Pheasant, MD

## 2016-02-19 ENCOUNTER — Encounter: Payer: Self-pay | Admitting: Internal Medicine

## 2016-02-19 NOTE — Assessment & Plan Note (Signed)
Continue vitamin D supplements.  

## 2016-02-19 NOTE — Assessment & Plan Note (Signed)
Low cholesterol diet and exercise.  On simvastatin.  Lipid panel and liver function tests.

## 2016-02-19 NOTE — Assessment & Plan Note (Signed)
Being followed by Dr Oliva Bustard.  Last evaluated 03/2015.  States recommended yearly f/u.  Wants scheduled in 04/2016.

## 2016-02-28 ENCOUNTER — Encounter: Payer: Self-pay | Admitting: Internal Medicine

## 2016-02-29 ENCOUNTER — Other Ambulatory Visit: Payer: Self-pay | Admitting: Internal Medicine

## 2016-02-29 DIAGNOSIS — R911 Solitary pulmonary nodule: Secondary | ICD-10-CM

## 2016-02-29 NOTE — Telephone Encounter (Signed)
Dr Oliva Bustard was following her for abnormal CT.  He has scheduled her to f/u with Dr B.  Usually they do the scan prior, so can discuss at appt.  I do prefer her to f/u with the cancer center for the scan and the appt.  She needs to contact the cancer center.

## 2016-03-12 ENCOUNTER — Other Ambulatory Visit: Payer: Self-pay | Admitting: Internal Medicine

## 2016-03-12 NOTE — Telephone Encounter (Signed)
Okay to refill? Last refilled on 09/20/15 for #90 with no refills

## 2016-03-12 NOTE — Telephone Encounter (Signed)
ok'd refill progesterone #90 with no refills.

## 2016-03-16 ENCOUNTER — Telehealth: Payer: Self-pay | Admitting: *Deleted

## 2016-03-16 NOTE — Telephone Encounter (Signed)
Received referral for low dose lung cancer screening CT scan from PCP . Voicemail left at phone number listed in EMR for patient to call me back to facilitate scheduling scan.

## 2016-03-27 ENCOUNTER — Other Ambulatory Visit: Payer: Self-pay | Admitting: Internal Medicine

## 2016-03-27 ENCOUNTER — Ambulatory Visit
Admission: RE | Admit: 2016-03-27 | Discharge: 2016-03-27 | Disposition: A | Discharge status: Home / Self Care | Payer: Commercial Managed Care - HMO | Source: Ambulatory Visit | Attending: Internal Medicine | Admitting: Internal Medicine

## 2016-03-27 DIAGNOSIS — J479 Bronchiectasis, uncomplicated: Secondary | ICD-10-CM | POA: Insufficient documentation

## 2016-03-27 DIAGNOSIS — R911 Solitary pulmonary nodule: Secondary | ICD-10-CM | POA: Diagnosis present

## 2016-03-27 DIAGNOSIS — R918 Other nonspecific abnormal finding of lung field: Secondary | ICD-10-CM | POA: Insufficient documentation

## 2016-04-05 ENCOUNTER — Inpatient Hospital Stay: Payer: Commercial Managed Care - HMO | Attending: Internal Medicine | Admitting: Internal Medicine

## 2016-04-05 ENCOUNTER — Ambulatory Visit: Payer: Commercial Managed Care - HMO | Admitting: Internal Medicine

## 2016-04-05 ENCOUNTER — Encounter: Payer: Self-pay | Admitting: *Deleted

## 2016-04-05 VITALS — BP 138/72 | HR 69 | Temp 96.7°F | Resp 20 | Wt 159.6 lb

## 2016-04-05 DIAGNOSIS — R590 Localized enlarged lymph nodes: Secondary | ICD-10-CM | POA: Diagnosis not present

## 2016-04-05 DIAGNOSIS — Z79899 Other long term (current) drug therapy: Secondary | ICD-10-CM | POA: Insufficient documentation

## 2016-04-05 DIAGNOSIS — R918 Other nonspecific abnormal finding of lung field: Secondary | ICD-10-CM | POA: Insufficient documentation

## 2016-04-05 DIAGNOSIS — I7300 Raynaud's syndrome without gangrene: Secondary | ICD-10-CM | POA: Diagnosis not present

## 2016-04-05 DIAGNOSIS — E785 Hyperlipidemia, unspecified: Secondary | ICD-10-CM | POA: Insufficient documentation

## 2016-04-05 NOTE — Progress Notes (Signed)
[facility] OFFICE PROGRESS NOTE  Patient Care Team: Einar Pheasant, MD as PCP - General (Internal Medicine) Einar Pheasant, MD (Internal Medicine)   SUMMARY OF HEMATOLOGIC/ONCOLOGIC HISTORY:  # 2015 MEDIASTINAL LN/ [facility] LN- CHRONIC [place] 2017- [facility] LN ~ 1.6CM; Dr.Kasa  INTERVAL HISTORY:  A very pleasant 75 year old female patient with no significant history of smoking- noted to have lung nodules/mediastinal adenopathy- is here to review the results of the CT scan.  Patient continues to deny any shortness of breath cough. Denies any weight loss. Denies any new lumps or bumps. No fatigue. No hemoptysis.  REVIEW OF SYSTEMS:  A complete 10 point review of system is done which is negative except mentioned above/history of present illness.   PAST MEDICAL HISTORY :  Past Medical History  Diagnosis Date  . Raynaud's phenomenon   . Hyperlipidemia   . Postmenopausal HRT (hormone replacement therapy)   . Nephrolithiasis   . Arthritis   . Cancer ([place])     basal cell  . Pulmonary nodules   . Renal stone     PAST SURGICAL HISTORY :   Past Surgical History  Procedure Laterality Date  . Tonsillectomy    . Lithotripsy    . Anal fissure surgery  9/06    FAMILY HISTORY :   Family History  Problem Relation Age of Onset  . Breast cancer Mother     died age 58  . Heart disease Father     s/p CABG  . Aneurysm Father   . Breast cancer      maternal niece  . Colon cancer Neg Hx     SOCIAL HISTORY:   Social History  Substance Use Topics  . Smoking status: Never Smoker   . Smokeless tobacco: Never Used  . Alcohol Use: No    ALLERGIES:  is allergic to sulfa antibiotics.  MEDICATIONS:  Current Outpatient Prescriptions  Medication Sig Dispense Refill  . Cholecalciferol (VITAMIN D-3) 1000 UNITS CAPS Take 1 capsule by mouth daily.    Marland Kitchen estropipate (OGEN) 0.75 MG tablet TAKE ONE-HALF TABLET BY MOUTH ONCE DAILY 90 tablet 0  . medroxyPROGESTERone (PROVERA) 2.5  MG tablet TAKE ONE TABLET BY MOUTH ONCE DAILY 90 tablet 0  . polycarbophil (FIBERCON) 625 MG tablet Take 625 mg by mouth daily.    . simvastatin (ZOCOR) 20 MG tablet TAKE ONE TABLET BY MOUTH ONCE DAILY 90 tablet 1   No current facility-administered medications for this visit.    PHYSICAL EXAMINATION:   BP 138/72 mmHg  Pulse 69  Temp(Src) 96.7 F (35.9 C) (Tympanic)  Wt 159 lb 9.8 oz (72.4 kg)  Filed Weights   04/05/16 1340  Weight: 159 lb 9.8 oz (72.4 kg)    GENERAL: Well-nourished well-developed; Alert, no distress and comfortable.   Accompanied by her husband. EYES: no pallor or icterus OROPHARYNX: no thrush or ulceration; good dentition  NECK: supple, no masses felt LYMPH:  no palpable lymphadenopathy in the cervical, axillary or inguinal regions LUNGS: clear to auscultation and  No wheeze or crackles HEART/CVS: regular rate & rhythm and no murmurs; No lower extremity edema ABDOMEN:abdomen soft, non-tender and normal bowel sounds Musculoskeletal:no cyanosis of digits and no clubbing  PSYCH: alert & oriented x 3 with fluent speech NEURO: no focal motor/sensory deficits SKIN:  no rashes or significant lesions  LABORATORY DATA:  I have reviewed the data as listed    Component Value Date/Time   NA 138 01/24/2016 0802   K 4.3 01/24/2016 0802  CL 103 01/24/2016 0802   CO2 30 01/24/2016 0802   GLUCOSE 84 01/24/2016 0802   BUN 12 01/24/2016 0802   CREATININE 1.02 01/24/2016 0802   CALCIUM 9.3 01/24/2016 0802   PROT 6.6 01/24/2016 0802   ALBUMIN 4.1 01/24/2016 0802   AST 23 01/24/2016 0802   ALT 20 01/24/2016 0802   ALKPHOS 62 01/24/2016 0802   BILITOT 0.6 01/24/2016 0802    No results found for: SPEP, UPEP  Lab Results  Component Value Date   WBC 6.1 03/22/2015   NEUTROABS 4.0 03/22/2015   HGB 12.8 03/22/2015   HCT 37.5 03/22/2015   MCV 87.3 03/22/2015   PLT 252.0 03/22/2015      Chemistry      Component Value Date/Time   NA 138 01/24/2016 0802   K  4.3 01/24/2016 0802   CL 103 01/24/2016 0802   CO2 30 01/24/2016 0802   BUN 12 01/24/2016 0802   CREATININE 1.02 01/24/2016 0802      Component Value Date/Time   CALCIUM 9.3 01/24/2016 0802   ALKPHOS 62 01/24/2016 0802   AST 23 01/24/2016 0802   ALT 20 01/24/2016 0802   BILITOT 0.6 01/24/2016 0802        ASSESSMENT & PLAN:   #  Chronically stable adenopathy in the chest including hilar, infrahilar, and mediastinal adenopathy- Question etiology. Patient is totally asymptomatic. Patient not too keen on any invasive procedures or further imaging at this time. We will reevaluate the imaging at the tumor conference. We'll communicate with the patient.  # Follow up as needed. I reviewed the images myself.   # All questions were answered. The patient knows to call the clinic with any problems, questions or concerns.      Cammie Sickle, MD 04/05/2016 1:44 PM

## 2016-04-05 NOTE — Progress Notes (Signed)
Patient ambulates independently, brought to exam room 4, accompanied by her husband.  Patient denies pain or discomfort.  Vitals and medication record updated, information provided by patient.

## 2016-05-08 ENCOUNTER — Encounter: Payer: Self-pay | Admitting: Family Medicine

## 2016-05-08 ENCOUNTER — Telehealth: Payer: Self-pay | Admitting: Internal Medicine

## 2016-05-08 ENCOUNTER — Ambulatory Visit (INDEPENDENT_AMBULATORY_CARE_PROVIDER_SITE_OTHER): Payer: Commercial Managed Care - HMO | Admitting: Family Medicine

## 2016-05-08 VITALS — BP 126/78 | HR 65 | Temp 98.3°F | Ht 65.0 in | Wt 159.6 lb

## 2016-05-08 DIAGNOSIS — L237 Allergic contact dermatitis due to plants, except food: Secondary | ICD-10-CM | POA: Insufficient documentation

## 2016-05-08 MED ORDER — TRIAMCINOLONE ACETONIDE 0.5 % EX OINT: 1.0000 "application " | TOPICAL_OINTMENT | Freq: Two times a day (BID) | CUTANEOUS | Status: DC

## 2016-05-08 NOTE — Patient Instructions (Signed)
Use the topical steroid twice/day to the affected areas.  Call if you fail to improve or worsen (Ask for Ashleigh or Dr. Nicki Reaper nurse when you call).  Take care  Dr. Lacinda Axon   [facility] ivy is a inflammation of the skin (contact dermatitis) caused by touching the allergens on the leaves of the ivy plant following previous exposure to the plant. The rash usually appears 48 hours after exposure. The rash is usually bumps (papules) or blisters (vesicles) in a linear pattern. Depending on your own sensitivity, the rash may simply cause redness and itching, or it may also progress to blisters which may break open. These must be well cared for to prevent secondary bacterial (germ) infection, followed by scarring. Keep any open areas dry, clean, dressed, and covered with an antibacterial ointment if needed. The eyes may also get puffy. The puffiness is worst in the morning and gets better as the day progresses. This dermatitis usually heals without scarring, within 2 to 3 weeks without treatment. HOME CARE INSTRUCTIONS  Thoroughly wash with soap and water as soon as you have been exposed to poison ivy. You have about one half hour to remove the plant resin before it will cause the rash. This washing will destroy the oil or antigen on the skin that is causing, or will cause, the rash. Be sure to wash under your fingernails as any plant resin there will continue to spread the rash. Do not rub skin vigorously when washing affected area. Poison ivy cannot spread if no oil from the plant remains on your body. A rash that has progressed to weeping sores will not spread the rash unless you have not washed thoroughly. It is also important to wash any clothes you have been wearing as these may carry active allergens. The rash will return if you wear the unwashed clothing, even several days later. Avoidance of the plant in the future is the best measure. Poison ivy plant can be recognized by the number of leaves.  Generally, poison ivy has three leaves with flowering branches on a single stem. Diphenhydramine may be purchased over the counter and used as needed for itching. Do not drive with this medication if it makes you drowsy.Ask your caregiver about medication for children. SEEK MEDICAL CARE IF:  Open sores develop.  Redness spreads beyond area of rash.  You notice purulent (pus-like) discharge.  You have increased pain.  Other signs of infection develop (such as fever).   This information is not intended to replace advice given to you by your health care provider. Make sure you discuss any questions you have with your health care provider.   Document Released: 11/16/2000 Document Revised: 02/11/2012 Document Reviewed: 04/27/2015 Elsevier Interactive Patient Education [employer].

## 2016-05-08 NOTE — Progress Notes (Signed)
Pre visit review using our clinic review tool, if applicable. No additional management support is needed unless otherwise documented below in the visit note. 

## 2016-05-08 NOTE — Assessment & Plan Note (Signed)
New problem. History and exam consistent with this. Discussed treatment options - systemic versus topical corticosteroid. Patient elected for topical steroid. Treating with triamcinolone ointment.

## 2016-05-08 NOTE — Progress Notes (Signed)
   Subjective:  Patient ID: Kristin Phelps, female    DOB: 1941/09/19  Age: 75 y.o. MRN: [id number]  CC: [name]  HPI:  75 year old female presents with the above complaint.  Patient states that she got into some poison ivy last week and subsequently developed a rash on Tuesday. It has continued to persist and has spread. Located on the arms and hands. He's been using topical calamine lotion as well as baking soda without resolution.  She reports that it is pruritic. No exacerbating factors. No other complaints this time.   Social Hx Social History   Social History  . Marital Status: Married    Spouse Name: N/A  . Number of Children: 2  . Years of Education: N/A   Social History Main Topics  . Smoking status: Never Smoker   . Smokeless tobacco: Never Used  . Alcohol Use: No  . Drug Use: No  . Sexual Activity: Not Asked   Other Topics Concern  . None   Social History Narrative   Review of Systems  Constitutional: Negative.   Skin: Positive for rash.   Objective:  BP 126/78 mmHg  Pulse 65  Temp(Src) 98.3 F (36.8 C) (Oral)  Ht 5\' 5"  (1.651 m)  Wt 159 lb 9.6 oz (72.394 kg)  BMI 26.56 kg/m2  SpO2 98%  BP/Weight 05/08/2016 04/05/2016 123XX123  Systolic BP 123XX123 [id number]  Diastolic BP 78 72 70  Wt. (Lbs) 159.6 159.61 159.8  BMI 26.56 26.56 26.59   Physical Exam  Constitutional: She is oriented to person, place, and time. She appears well-developed. No distress.  Pulmonary/Chest: Effort normal.  Neurological: She is alert and oriented to person, place, and time.  Skin:  Papular erythematous rash (some areas with vesicles) noted on the R upper medial arm, bilateral forearms, L hand/wrist.  Psychiatric: She has a normal mood and affect.  Vitals reviewed.  Lab Results  Component Value Date   WBC 6.1 03/22/2015   HGB 12.8 03/22/2015   HCT 37.5 03/22/2015   PLT 252.0 03/22/2015   GLUCOSE 84 01/24/2016   CHOL 165 01/24/2016   TRIG 118.0 01/24/2016   HDL 48.30  01/24/2016   LDLCALC 93 01/24/2016   ALT 20 01/24/2016   AST 23 01/24/2016   NA 138 01/24/2016   K 4.3 01/24/2016   CL 103 01/24/2016   CREATININE 1.02 01/24/2016   BUN 12 01/24/2016   CO2 30 01/24/2016   TSH 3.25 07/19/2015   Assessment & Plan:   Problem List Items Addressed This Visit    Poison ivy dermatitis - Primary    New problem. History and exam consistent with this. Discussed treatment options - systemic versus topical corticosteroid. Patient elected for topical steroid. Treating with triamcinolone ointment.         Meds ordered this encounter  Medications  . triamcinolone ointment (KENALOG) 0.5 %    Sig: Apply 1 application topically 2 (two) times daily.    Dispense:  30 g    Refill:  0   Follow-up: PRN  [place]

## 2016-05-08 NOTE — Telephone Encounter (Signed)
Spoke with her, added her to Dr. Jonathon Jordan schedule. thanks

## 2016-05-08 NOTE — Telephone Encounter (Signed)
Pt called about coming in contact with poison ivy last week. Itch a lot and it's spreading. Pt states she had something on her in March but it went away. Pt states it is between her elbow and arm and it leaking fluid. Pt wants to know what can she put on it? Pt says she is going to a funeral today at about 12pm.  Call pt @ [phone]. Thank you!

## 2016-05-11 ENCOUNTER — Encounter: Payer: Self-pay | Admitting: Internal Medicine

## 2016-07-24 ENCOUNTER — Other Ambulatory Visit: Payer: Commercial Managed Care - HMO

## 2016-07-24 DIAGNOSIS — H18413 Arcus senilis, bilateral: Secondary | ICD-10-CM | POA: Diagnosis not present

## 2016-07-24 DIAGNOSIS — H02839 Dermatochalasis of unspecified eye, unspecified eyelid: Secondary | ICD-10-CM | POA: Diagnosis not present

## 2016-07-24 DIAGNOSIS — H2513 Age-related nuclear cataract, bilateral: Secondary | ICD-10-CM | POA: Diagnosis not present

## 2016-07-24 DIAGNOSIS — H2511 Age-related nuclear cataract, right eye: Secondary | ICD-10-CM | POA: Diagnosis not present

## 2016-07-27 ENCOUNTER — Other Ambulatory Visit (INDEPENDENT_AMBULATORY_CARE_PROVIDER_SITE_OTHER): Payer: Commercial Managed Care - HMO

## 2016-07-27 DIAGNOSIS — E559 Vitamin D deficiency, unspecified: Secondary | ICD-10-CM

## 2016-07-27 DIAGNOSIS — R9389 Abnormal findings on diagnostic imaging of other specified body structures: Secondary | ICD-10-CM

## 2016-07-27 DIAGNOSIS — R938 Abnormal findings on diagnostic imaging of other specified body structures: Secondary | ICD-10-CM | POA: Diagnosis not present

## 2016-07-27 DIAGNOSIS — E7800 Pure hypercholesterolemia, unspecified: Secondary | ICD-10-CM

## 2016-07-27 LAB — BASIC METABOLIC PANEL
BUN: 14 mg/dL (ref 6–23)
CALCIUM: 9 mg/dL (ref 8.4–10.5)
CO2: 29 mEq/L (ref 19–32)
Chloride: 106 mEq/L (ref 96–112)
Creatinine, Ser: 1 mg/dL (ref 0.40–1.20)
GFR: 57.14 mL/min — AB (ref >60.00)
GLUCOSE: 90 mg/dL (ref 70–99)
Potassium: 4.1 mEq/L (ref 3.5–5.1)
SODIUM: 142 meq/L (ref 135–145)

## 2016-07-27 LAB — CBC WITH DIFFERENTIAL/PLATELET
BASOS ABS: 0.1 10*3/uL (ref 0.0–0.1)
Basophils Relative: 0.9 % (ref 0.0–3.0)
EOS ABS: 0.5 10*3/uL (ref 0.0–0.7)
Eosinophils Relative: 7.1 % — ABNORMAL HIGH (ref 0.0–5.0)
HEMATOCRIT: 37.5 % (ref 36.0–46.0)
HEMOGLOBIN: 12.8 g/dL (ref 12.0–15.0)
LYMPHS PCT: 18.2 % (ref 12.0–46.0)
Lymphs Abs: 1.2 10*3/uL (ref 0.7–4.0)
MCHC: 34.2 g/dL (ref 30.0–36.0)
MCV: 88.6 fl (ref 78.0–100.0)
MONOS PCT: 8.2 % (ref 3.0–12.0)
Monocytes Absolute: 0.5 10*3/uL (ref 0.1–1.0)
NEUTROS ABS: 4.3 10*3/uL (ref 1.4–7.7)
Neutrophils Relative %: 65.6 % (ref 43.0–77.0)
PLATELETS: 256 10*3/uL (ref 150.0–400.0)
RBC: 4.24 Mil/uL (ref 3.87–5.11)
RDW: 13 % (ref 11.5–15.5)
WBC: 6.5 10*3/uL (ref 4.0–10.5)

## 2016-07-27 LAB — LIPID PANEL
CHOLESTEROL: 156 mg/dL (ref 0–200)
HDL: 48.2 mg/dL (ref >39.00)
LDL CALC: 78 mg/dL (ref 0–99)
NonHDL: 107.65
TRIGLYCERIDES: 147 mg/dL (ref 0.0–149.0)
Total CHOL/HDL Ratio: 3
VLDL: 29.4 mg/dL (ref 0.0–40.0)

## 2016-07-27 LAB — HEPATIC FUNCTION PANEL
ALT: 15 U/L (ref 0–35)
AST: 19 U/L (ref 0–37)
Albumin: 3.9 g/dL (ref 3.5–5.2)
Alkaline Phosphatase: 60 U/L (ref 39–117)
BILIRUBIN TOTAL: 0.4 mg/dL (ref 0.2–1.2)
Bilirubin, Direct: 0 mg/dL (ref 0.0–0.3)
TOTAL PROTEIN: 6.4 g/dL (ref 6.0–8.3)

## 2016-07-27 LAB — VITAMIN D 25 HYDROXY (VIT D DEFICIENCY, FRACTURES): VITD: 33.34 ng/mL (ref 30.00–100.00)

## 2016-07-27 LAB — TSH: TSH: 2.83 u[IU]/mL (ref 0.35–4.50)

## 2016-07-29 ENCOUNTER — Encounter: Payer: Self-pay | Admitting: Internal Medicine

## 2016-07-31 ENCOUNTER — Other Ambulatory Visit: Payer: Self-pay | Admitting: Internal Medicine

## 2016-07-31 ENCOUNTER — Ambulatory Visit (INDEPENDENT_AMBULATORY_CARE_PROVIDER_SITE_OTHER): Payer: Commercial Managed Care - HMO | Admitting: Internal Medicine

## 2016-07-31 ENCOUNTER — Encounter: Payer: Self-pay | Admitting: Internal Medicine

## 2016-07-31 VITALS — BP 108/62 | HR 72 | Temp 98.5°F | Resp 16 | Ht 64.5 in | Wt 159.0 lb

## 2016-07-31 DIAGNOSIS — M79641 Pain in right hand: Secondary | ICD-10-CM

## 2016-07-31 DIAGNOSIS — R938 Abnormal findings on diagnostic imaging of other specified body structures: Secondary | ICD-10-CM

## 2016-07-31 DIAGNOSIS — E7800 Pure hypercholesterolemia, unspecified: Secondary | ICD-10-CM | POA: Diagnosis not present

## 2016-07-31 DIAGNOSIS — Z1239 Encounter for other screening for malignant neoplasm of breast: Secondary | ICD-10-CM

## 2016-07-31 DIAGNOSIS — R9389 Abnormal findings on diagnostic imaging of other specified body structures: Secondary | ICD-10-CM

## 2016-07-31 DIAGNOSIS — E559 Vitamin D deficiency, unspecified: Secondary | ICD-10-CM | POA: Diagnosis not present

## 2016-07-31 DIAGNOSIS — Z0000 Encounter for general adult medical examination without abnormal findings: Secondary | ICD-10-CM | POA: Diagnosis not present

## 2016-07-31 MED ORDER — MEDROXYPROGESTERONE ACETATE 2.5 MG PO TABS: 2.5000 mg | ORAL_TABLET | Freq: Every day | ORAL | 0 refills | Status: DC

## 2016-07-31 MED ORDER — SIMVASTATIN 20 MG PO TABS: 20.0000 mg | ORAL_TABLET | Freq: Every day | ORAL | 1 refills | Status: DC

## 2016-07-31 MED ORDER — ESTROPIPATE 0.75 MG PO TABS: 0.3750 mg | ORAL_TABLET | Freq: Every day | ORAL | 0 refills | Status: DC

## 2016-07-31 NOTE — Progress Notes (Signed)
Patient ID: TANEA BORGES, female   DOB: [birth year], 75 y.o.   MRN: [id number]   Subjective:    Patient ID: [name], female    DOB: 1941/11/26, 75 y.o.   MRN: [id number]  HPI  Patient here for her physical exam.   Being followed by oncology - lung nodules.  Just saw Dr Rogue Bussing in 04/2016.  Changes on CT felt to be stable.  Was going to discuss f/u at tumor conference.  She denies any chest congestion or cough.  No sob.  No chest pain.  No acid reflux.  No abdominal pain or cramping.  Bowels stable.  Does report noticing some intermittent pain on the ulnar side of her right hand.  No known injury.  No specific trigger.  Intermittent.  No pain reproducible on exam.  Discussed further treatment and w/up. She declines.  Wants to monitor.     Past Medical History:  Diagnosis Date  . Arthritis   . Cancer ([place])    basal cell  . Hyperlipidemia   . Nephrolithiasis   . Postmenopausal HRT (hormone replacement therapy)   . Pulmonary nodules   . Raynaud's phenomenon   . Renal stone    Past Surgical History:  Procedure Laterality Date  . anal fissure surgery  9/06  . LITHOTRIPSY    . TONSILLECTOMY     Family History  Problem Relation Age of Onset  . Breast cancer Mother     died age 66  . Heart disease Father     s/p CABG  . Aneurysm Father   . Breast cancer      maternal niece  . Colon cancer Neg Hx    Social History   Social History  . Marital status: Married    Spouse name: Gershon Mussel  . Number of children: 2  . Years of education: N/A   Occupational History  .  [employer]   Social History Main Topics  . Smoking status: Never Smoker  . Smokeless tobacco: Never Used  . Alcohol use No  . Drug use: No  . Sexual activity: Not Currently   Other Topics Concern  . None   Social History Narrative  . None    Outpatient Encounter Prescriptions as of 07/31/2016  Medication Sig  . BESIVANCE 0.6 % SUSP   . Cholecalciferol (VITAMIN D-3) 1000 UNITS CAPS  Take 1 capsule by mouth daily.  . DUREZOL 0.05 % EMUL   . estropipate (OGEN) 0.75 MG tablet Take 0.5 tablets (0.375 mg total) by mouth daily.  . medroxyPROGESTERone (PROVERA) 2.5 MG tablet Take 1 tablet (2.5 mg total) by mouth daily.  . polycarbophil (FIBERCON) 625 MG tablet Take 625 mg by mouth daily.  . simvastatin (ZOCOR) 20 MG tablet Take 1 tablet (20 mg total) by mouth daily.  . [DISCONTINUED] estropipate (OGEN) 0.75 MG tablet TAKE ONE-HALF TABLET BY MOUTH ONCE DAILY  . [DISCONTINUED] medroxyPROGESTERone (PROVERA) 2.5 MG tablet TAKE ONE TABLET BY MOUTH ONCE DAILY  . [DISCONTINUED] simvastatin (ZOCOR) 20 MG tablet TAKE ONE TABLET BY MOUTH ONCE DAILY  . [DISCONTINUED] triamcinolone ointment (KENALOG) 0.5 % Apply 1 application topically 2 (two) times daily.   No facility-administered encounter medications on file as of 07/31/2016.     Review of Systems  Constitutional: Negative for appetite change and unexpected weight change.  HENT: Negative for congestion and sinus pressure.   Eyes: Negative for pain and visual disturbance.  Respiratory: Negative for cough, chest tightness and shortness of breath.  Cardiovascular: Negative for chest pain, palpitations and leg swelling.  Gastrointestinal: Negative for abdominal pain, diarrhea, nausea and vomiting.  Genitourinary: Negative for difficulty urinating and dysuria.  Musculoskeletal: Negative for back pain and joint swelling.       Right hand pain as outlined.    Skin: Negative for color change and rash.  Neurological: Negative for dizziness, light-headedness and headaches.  Hematological: Negative for adenopathy. Does not bruise/bleed easily.  Psychiatric/Behavioral: Negative for agitation and dysphoric mood.       Objective:    Physical Exam  Constitutional: She is oriented to person, place, and time. She appears well-developed and well-nourished. No distress.  HENT:  Nose: Nose normal.  Mouth/Throat: Oropharynx is clear and moist.   Eyes: Right eye exhibits no discharge. Left eye exhibits no discharge. No scleral icterus.  Neck: Neck supple. No thyromegaly present.  Cardiovascular: Normal rate and regular rhythm.   Pulmonary/Chest: Breath sounds normal. No accessory muscle usage. No tachypnea. No respiratory distress. She has no decreased breath sounds. She has no wheezes. She has no rhonchi. Right breast exhibits no inverted nipple, no mass, no nipple discharge and no tenderness (no axillary adenopathy). Left breast exhibits no inverted nipple, no mass, no nipple discharge and no tenderness (no axilarry adenopathy).  Abdominal: Soft. Bowel sounds are normal. There is no tenderness.  Musculoskeletal: She exhibits no edema or tenderness.  Lymphadenopathy:    She has no cervical adenopathy.  Neurological: She is alert and oriented to person, place, and time.  Skin: Skin is warm. No rash noted. No erythema.  Psychiatric: She has a normal mood and affect. Her behavior is normal.    BP 108/62 (BP Location: Left Arm, Patient Position: Sitting, Cuff Size: Normal)   Pulse 72   Temp 98.5 F (36.9 C) (Oral)   Resp 16   Ht 5' 4.5" (1.638 m)   Wt 159 lb (72.1 kg)   BMI 26.87 kg/m  Wt Readings from Last 3 Encounters:  07/31/16 159 lb (72.1 kg)  05/08/16 159 lb 9.6 oz (72.4 kg)  04/05/16 159 lb 9.8 oz (72.4 kg)     Lab Results  Component Value Date   WBC 6.5 07/27/2016   HGB 12.8 07/27/2016   HCT 37.5 07/27/2016   PLT 256.0 07/27/2016   GLUCOSE 90 07/27/2016   CHOL 156 07/27/2016   TRIG 147.0 07/27/2016   HDL 48.20 07/27/2016   LDLCALC 78 07/27/2016   ALT 15 07/27/2016   AST 19 07/27/2016   NA 142 07/27/2016   K 4.1 07/27/2016   CL 106 07/27/2016   CREATININE 1.00 07/27/2016   BUN 14 07/27/2016   CO2 29 07/27/2016   TSH 2.83 07/27/2016    Ct Chest Wo Contrast  Result Date: 03/27/2016 CLINICAL DATA:  Lung nodule followup EXAM: CT CHEST WITHOUT CONTRAST TECHNIQUE: Multidetector CT imaging of the chest was  performed following the standard protocol without IV contrast. COMPARISON:  03/22/2015 FINDINGS: Mediastinum/Nodes: Right paratracheal node short axis diameter 1.6 cm on image 44/2, previously the same by my measurement. Other regional hilar, infrahilar, and mediastinal lymph nodes are similarly pathologically enlarged. Not appreciably changed from prior. Mild atherosclerotic calcification of the aortic arch and left anterior descending coronary artery, without cardiomegaly. Lungs/Pleura: Mild biapical pleural parenchymal scarring with some associated calcification. No significant peribronchovascular or septal nodularity. There is a 3 mm apical posterior segment left upper lobe nodule on image 19/3, stable. 3 by 4 mm right upper lobe pulmonary nodule on image 41/3, stable. Cylindrical bronchiectasis  in both lower lobes without current airway thickening. Upper abdomen: No splenomegaly. No visible upper abdominal adenopathy identified. Musculoskeletal: Thoracic spondylosis. IMPRESSION: 1. Chronically stable adenopathy in the chest including hilar, infrahilar, and mediastinal adenopathy. There is some cylindrical bronchiectasis in the lower lobes but no nodular airway thickening or septal nodularity to further suggest sarcoidosis. Several tiny stable pulmonary nodules. Causes for such chronic mediastinal adenopathy might include sarcoidosis or lymphoma. Castleman disease possible but less likely. Biopsy could be performed for further characterization. Electronically Signed   By: Van Clines M.D.   On: 03/27/2016 15:30       Assessment & Plan:   Problem List Items Addressed This Visit    Abnormal chest CT    Being followed by oncology.  Last CT stable.       Health care maintenance    Physical today 07/31/16.  Colonoscopy 2005.  Overdue f/u colonoscopy.  Discussed f/u colonoscopy.  She declines.  Discussed cologuard.  She will let me know if agreeable.  Bone density 12/2011 - normal.  Mammogram  scheduled.        Hypercholesterolemia    On simvastatin.  Low cholesterol diet and exercise.  Follow lipid panel and liver function tests.   Lab Results  Component Value Date   CHOL 156 07/27/2016   HDL 48.20 07/27/2016   LDLCALC 78 07/27/2016   TRIG 147.0 07/27/2016   CHOLHDL 3 07/27/2016        Relevant Medications   simvastatin (ZOCOR) 20 MG tablet   Other Relevant Orders   Comprehensive metabolic panel   Lipid panel   Vitamin D deficiency    Continue vitamin D supplements.  Follow vitamin D level.  Recent check wnl.        Other Visit Diagnoses    Routine general medical examination at a health care facility    -  Primary   Breast cancer screening       Right hand pain       no pain reproducible on exam.  discussed further treatment and evaluation.  she declines.  wants to monitor.  follow.        Einar Pheasant, MD

## 2016-08-01 ENCOUNTER — Encounter: Payer: Self-pay | Admitting: Internal Medicine

## 2016-08-01 NOTE — Assessment & Plan Note (Signed)
Being followed by oncology.  Last CT stable.

## 2016-08-01 NOTE — Assessment & Plan Note (Addendum)
Continue vitamin D supplements.  Follow vitamin D level.  Recent check wnl.

## 2016-08-01 NOTE — Assessment & Plan Note (Signed)
Physical today 07/31/16.  Colonoscopy 2005.  Overdue f/u colonoscopy.  Discussed f/u colonoscopy.  She declines.  Discussed cologuard.  She will let me know if agreeable.  Bone density 12/2011 - normal.  Mammogram scheduled.

## 2016-08-01 NOTE — Assessment & Plan Note (Addendum)
On simvastatin.  Low cholesterol diet and exercise.  Follow lipid panel and liver function tests.   Lab Results  Component Value Date   CHOL 156 07/27/2016   HDL 48.20 07/27/2016   LDLCALC 78 07/27/2016   TRIG 147.0 07/27/2016   CHOLHDL 3 07/27/2016

## 2016-08-24 ENCOUNTER — Ambulatory Visit
Admission: RE | Admit: 2016-08-24 | Discharge: 2016-08-24 | Disposition: A | Discharge status: Home / Self Care | Payer: Commercial Managed Care - HMO | Source: Ambulatory Visit | Attending: Internal Medicine | Admitting: Internal Medicine

## 2016-08-24 DIAGNOSIS — H2512 Age-related nuclear cataract, left eye: Secondary | ICD-10-CM | POA: Diagnosis not present

## 2016-08-24 DIAGNOSIS — H2511 Age-related nuclear cataract, right eye: Secondary | ICD-10-CM | POA: Diagnosis not present

## 2016-08-24 DIAGNOSIS — Z1239 Encounter for other screening for malignant neoplasm of breast: Secondary | ICD-10-CM

## 2016-08-31 ENCOUNTER — Telehealth: Payer: Self-pay | Admitting: Internal Medicine

## 2016-08-31 NOTE — Telephone Encounter (Signed)
Pt lvm stating that she was seen in [facility] for her cataract surgery. That doctor referred her to go back to [facility]. Per [employer] they are out of network with her insurance and she needs to know what to do.  I attempted to call her back to get a little more information from her. Need the doctors name that she is needing to see. She may need to be referred to another provider in [place] since she has [facility] or she would have to pay out of network cost.

## 2016-09-07 DIAGNOSIS — H2512 Age-related nuclear cataract, left eye: Secondary | ICD-10-CM | POA: Diagnosis not present

## 2016-09-18 ENCOUNTER — Ambulatory Visit
Admission: RE | Admit: 2016-09-18 | Discharge: 2016-09-18 | Disposition: A | Discharge status: Home / Self Care | Payer: Commercial Managed Care - HMO | Source: Ambulatory Visit | Attending: Internal Medicine | Admitting: Internal Medicine

## 2016-09-18 DIAGNOSIS — Z1231 Encounter for screening mammogram for malignant neoplasm of breast: Secondary | ICD-10-CM | POA: Diagnosis not present

## 2016-09-18 DIAGNOSIS — Z1239 Encounter for other screening for malignant neoplasm of breast: Secondary | ICD-10-CM

## 2016-12-17 ENCOUNTER — Telehealth: Payer: Self-pay | Admitting: Internal Medicine

## 2016-12-17 NOTE — Telephone Encounter (Signed)
Pt called and c/o coughing, sore from coughing, low grade under 100, congestion, yellow mucus. Would like to know if Dr. Nicki Reaper would call in medication. Please advise, thank you!  Call pt @ [phone] (until 2)

## 2016-12-17 NOTE — Telephone Encounter (Signed)
Tried calling patient she wasn't at home.

## 2016-12-17 NOTE — Telephone Encounter (Signed)
Reason for call:cough Symptoms: fever under 100, coughing hurts in bronchial tubes, yellow mucus Duration since last Thursday Medications:Mucinex a couples of times  Last seen for this problem: Would like Zpak called in .   Patient declines to go to urgent care to work schedule.  Explained no open appointments today.  Appointments open tomorrow.   Scheduled with Mable Paris tomorrow . Please advise.

## 2016-12-17 NOTE — Telephone Encounter (Signed)
Agree with need for evaluation.  Thank you.

## 2016-12-18 ENCOUNTER — Ambulatory Visit (INDEPENDENT_AMBULATORY_CARE_PROVIDER_SITE_OTHER): Payer: Commercial Managed Care - HMO | Admitting: Family

## 2016-12-18 ENCOUNTER — Encounter: Payer: Self-pay | Admitting: Family

## 2016-12-18 VITALS — BP 122/62 | HR 73 | Temp 98.2°F | Ht 64.5 in | Wt 160.4 lb

## 2016-12-18 DIAGNOSIS — J209 Acute bronchitis, unspecified: Secondary | ICD-10-CM

## 2016-12-18 NOTE — Progress Notes (Signed)
Pre visit review using our clinic review tool, if applicable. No additional management support is needed unless otherwise documented below in the visit note. 

## 2016-12-18 NOTE — Patient Instructions (Signed)
Honey as discussed for cough. If you decide need something stronger for cough, please let me know. Please let me know if symptoms do not improve/

## 2016-12-18 NOTE — Progress Notes (Signed)
Subjective:    Patient ID: Kristin Phelps, female    DOB: 07/23/1941, 76 y.o.   MRN: [id number]  CC: Kristin Phelps is a 76 y.o. female who presents today for an acute visit.    HPI: CC: productive cough x 6 days, improving. Worse at night and in the morning. Endorses tmax 99 over the past couple of days, ear pressure, post nasal drip  Took one mucinex and one antihistamine, with some relief.  No lung disease.    HISTORY:  Past Medical History:  Diagnosis Date  . Arthritis   . Cancer ([place])    basal cell  . Hyperlipidemia   . Nephrolithiasis   . Postmenopausal HRT (hormone replacement therapy)   . Pulmonary nodules   . Raynaud's phenomenon   . Renal stone    Past Surgical History:  Procedure Laterality Date  . anal fissure surgery  9/06  . LITHOTRIPSY    . TONSILLECTOMY     Family History  Problem Relation Age of Onset  . Breast cancer Mother     died age 59  . Heart disease Father     s/p CABG  . Aneurysm Father   . Breast cancer      maternal niece  . Colon cancer Neg Hx     Allergies: Sulfa antibiotics and Triamcinolone Current Outpatient Prescriptions on File Prior to Visit  Medication Sig Dispense Refill  . BESIVANCE 0.6 % SUSP     . Cholecalciferol (VITAMIN D-3) 1000 UNITS CAPS Take 1 capsule by mouth daily.    . DUREZOL 0.05 % EMUL     . estropipate (OGEN) 0.75 MG tablet Take 0.5 tablets (0.375 mg total) by mouth daily. 90 tablet 0  . medroxyPROGESTERone (PROVERA) 2.5 MG tablet Take 1 tablet (2.5 mg total) by mouth daily. 90 tablet 0  . polycarbophil (FIBERCON) 625 MG tablet Take 625 mg by mouth daily.    . simvastatin (ZOCOR) 20 MG tablet Take 1 tablet (20 mg total) by mouth daily. 90 tablet 1   No current facility-administered medications on file prior to visit.     Social History  Substance Use Topics  . Smoking status: Never Smoker  . Smokeless tobacco: Never Used  . Alcohol use No    Review of Systems  Constitutional: Negative for  chills and fever.  HENT: Positive for congestion and postnasal drip. Negative for ear pain (resolved) and sore throat.   Respiratory: Positive for cough. Negative for shortness of breath and wheezing.   Cardiovascular: Negative for chest pain and palpitations.  Gastrointestinal: Negative for nausea and vomiting.      Objective:    BP 122/62   Pulse 73   Temp 98.2 F (36.8 C) (Oral)   Ht 5' 4.5" (1.638 m)   Wt 160 lb 6.4 oz (72.8 kg)   SpO2 98%   BMI 27.11 kg/m    Physical Exam  Constitutional: She appears well-developed and well-nourished.  HENT:  Head: Normocephalic and atraumatic.  Right Ear: Hearing, tympanic membrane, external ear and ear canal normal. No drainage, swelling or tenderness. No foreign bodies. Tympanic membrane is not erythematous and not bulging. No middle ear effusion. No decreased hearing is noted.  Left Ear: Hearing, tympanic membrane, external ear and ear canal normal. No drainage, swelling or tenderness. No foreign bodies. Tympanic membrane is not erythematous and not bulging.  No middle ear effusion. No decreased hearing is noted.  Nose: Nose normal. No rhinorrhea. Right sinus exhibits no maxillary sinus  tenderness and no frontal sinus tenderness. Left sinus exhibits no maxillary sinus tenderness and no frontal sinus tenderness.  Mouth/Throat: Uvula is midline, oropharynx is clear and moist and mucous membranes are normal. No oropharyngeal exudate, posterior oropharyngeal edema, posterior oropharyngeal erythema or tonsillar abscesses.  Eyes: Conjunctivae are normal.  Cardiovascular: Regular rhythm, normal heart sounds and normal pulses.   Pulmonary/Chest: Effort normal and breath sounds normal. She has no wheezes. She has no rhonchi. She has no rales.  Lymphadenopathy:       Head (right side): No submental, no submandibular, no tonsillar, no preauricular, no posterior auricular and no occipital adenopathy present.       Head (left side): No submental, no  submandibular, no tonsillar, no preauricular, no posterior auricular and no occipital adenopathy present.    She has no cervical adenopathy.  Neurological: She is alert.  Skin: Skin is warm and dry.  Psychiatric: She has a normal mood and affect. Her speech is normal and behavior is normal. Thought content normal.  Vitals reviewed.      Assessment & Plan:   1. Acute bronchitis, unspecified organism SaO2 98%. Patient is afebrile. No acute respiratory distress or adventitious lung sounds. Patient is clinically improving. She politely declines any cough medication. Advised her to the start Mucinex and use honey for cough. Patient will let me know if she's not improving. In the past she is responded well to azithromycin and we will consider that medication if no improvement with conservative treatment.     I am having Ms. Murtagh maintain her polycarbophil, Vitamin D-3, BESIVANCE, DUREZOL, simvastatin, medroxyPROGESTERone, and estropipate.   No orders of the defined types were placed in this encounter.   Return precautions given.   Risks, benefits, and alternatives of the medications and treatment plan prescribed today were discussed, and patient expressed understanding.   Education regarding symptom management and diagnosis given to patient on AVS.  Continue to follow with Einar Pheasant, MD for routine health maintenance.   Kristin Phelps and I agreed with plan.   Mable Paris, FNP

## 2017-01-11 ENCOUNTER — Telehealth: Payer: Self-pay | Admitting: Internal Medicine

## 2017-01-11 NOTE — Telephone Encounter (Signed)
I called pt and left a vm to call the office to sch AWV. Thank you! °

## 2017-01-20 ENCOUNTER — Other Ambulatory Visit: Payer: Self-pay | Admitting: Internal Medicine

## 2017-01-25 ENCOUNTER — Other Ambulatory Visit (INDEPENDENT_AMBULATORY_CARE_PROVIDER_SITE_OTHER): Payer: Commercial Managed Care - HMO

## 2017-01-25 DIAGNOSIS — E7800 Pure hypercholesterolemia, unspecified: Secondary | ICD-10-CM

## 2017-01-25 LAB — LIPID PANEL
Cholesterol: 165 mg/dL (ref 0–200)
HDL: 50.1 mg/dL (ref >39.00)
LDL Cholesterol: 87 mg/dL (ref 0–99)
NONHDL: 115.23
Total CHOL/HDL Ratio: 3
Triglycerides: 140 mg/dL (ref 0.0–149.0)
VLDL: 28 mg/dL (ref 0.0–40.0)

## 2017-01-25 LAB — COMPREHENSIVE METABOLIC PANEL
ALK PHOS: 64 U/L (ref 39–117)
ALT: 17 U/L (ref 0–35)
AST: 20 U/L (ref 0–37)
Albumin: 4 g/dL (ref 3.5–5.2)
BUN: 11 mg/dL (ref 6–23)
CO2: 29 meq/L (ref 19–32)
Calcium: 9.4 mg/dL (ref 8.4–10.5)
Chloride: 105 mEq/L (ref 96–112)
Creatinine, Ser: 1.09 mg/dL (ref 0.40–1.20)
GFR: 51.66 mL/min — AB (ref >60.00)
GLUCOSE: 92 mg/dL (ref 70–99)
POTASSIUM: 4.2 meq/L (ref 3.5–5.1)
Sodium: 141 mEq/L (ref 135–145)
Total Bilirubin: 0.5 mg/dL (ref 0.2–1.2)
Total Protein: 6.5 g/dL (ref 6.0–8.3)

## 2017-01-27 ENCOUNTER — Encounter: Payer: Self-pay | Admitting: Internal Medicine

## 2017-01-29 ENCOUNTER — Encounter: Payer: Self-pay | Admitting: Internal Medicine

## 2017-01-29 ENCOUNTER — Ambulatory Visit (INDEPENDENT_AMBULATORY_CARE_PROVIDER_SITE_OTHER): Payer: Commercial Managed Care - HMO | Admitting: Internal Medicine

## 2017-01-29 DIAGNOSIS — E559 Vitamin D deficiency, unspecified: Secondary | ICD-10-CM | POA: Diagnosis not present

## 2017-01-29 DIAGNOSIS — R938 Abnormal findings on diagnostic imaging of other specified body structures: Secondary | ICD-10-CM

## 2017-01-29 DIAGNOSIS — R9389 Abnormal findings on diagnostic imaging of other specified body structures: Secondary | ICD-10-CM

## 2017-01-29 DIAGNOSIS — E7800 Pure hypercholesterolemia, unspecified: Secondary | ICD-10-CM

## 2017-01-29 NOTE — Progress Notes (Signed)
Pre-visit discussion using our clinic review tool. No additional management support is needed unless otherwise documented below in the visit note.  

## 2017-01-29 NOTE — Assessment & Plan Note (Signed)
On simvastatin.  Low cholesterol diet and exercise.  Follow lipid panel and liver function tests.   

## 2017-01-29 NOTE — Progress Notes (Signed)
Patient ID: EDELINE JAUDON, female   DOB: 10/10/1941, 76 y.o.   MRN: [id number]   Subjective:    Patient ID: [name], female    DOB: 20-Feb-1941, 76 y.o.   MRN: [id number]  HPI  Patient here for a scheduled follow up.  She was seen 12/18/16 and diagnosed with bronchitis.  Instructed to take mucinex.  Symptoms have resolved now.  No chest congestion.  No cough.  No increased nasal congestion or drainage.  Overall breathing doing well.  No chest pain.  No sob.  No acid reflux.  No abdominal pain.  Bowels doing well.    Past Medical History:  Diagnosis Date  . Arthritis   . Cancer ([place])    basal cell  . Hyperlipidemia   . Nephrolithiasis   . Postmenopausal HRT (hormone replacement therapy)   . Pulmonary nodules   . Raynaud's phenomenon   . Renal stone    Past Surgical History:  Procedure Laterality Date  . anal fissure surgery  9/06  . LITHOTRIPSY    . TONSILLECTOMY     Family History  Problem Relation Age of Onset  . Breast cancer Mother     died age 92  . Heart disease Father     s/p CABG  . Aneurysm Father   . Breast cancer      maternal niece  . Colon cancer Neg Hx    Social History   Social History  . Marital status: Married    Spouse name: Gershon Mussel  . Number of children: 2  . Years of education: N/A   Occupational History  .  [employer]   Social History Main Topics  . Smoking status: Never Smoker  . Smokeless tobacco: Never Used  . Alcohol use No  . Drug use: No  . Sexual activity: Not Currently   Other Topics Concern  . None   Social History Narrative  . None    Outpatient Encounter Prescriptions as of 01/29/2017  Medication Sig  . Cholecalciferol (VITAMIN D-3) 1000 UNITS CAPS Take 1 capsule by mouth daily.  Marland Kitchen estropipate (OGEN) 0.75 MG tablet Take 0.5 tablets (0.375 mg total) by mouth daily.  . medroxyPROGESTERone (PROVERA) 2.5 MG tablet Take 1 tablet (2.5 mg total) by mouth daily.  . polycarbophil (FIBERCON) 625 MG tablet  Take 625 mg by mouth daily.  . simvastatin (ZOCOR) 20 MG tablet TAKE ONE TABLET BY MOUTH ONCE DAILY  . [DISCONTINUED] BESIVANCE 0.6 % SUSP   . [DISCONTINUED] DUREZOL 0.05 % EMUL    No facility-administered encounter medications on file as of 01/29/2017.     Review of Systems  Constitutional: Negative for appetite change and unexpected weight change.  HENT: Negative for congestion and sinus pressure.   Respiratory: Negative for cough, chest tightness and shortness of breath.   Cardiovascular: Negative for chest pain, palpitations and leg swelling.  Gastrointestinal: Negative for abdominal pain, nausea and vomiting.  Genitourinary: Negative for difficulty urinating and dysuria.  Musculoskeletal: Negative for back pain and joint swelling.  Skin: Negative for color change and rash.  Neurological: Negative for dizziness, light-headedness and headaches.  Psychiatric/Behavioral: Negative for agitation and dysphoric mood.       Objective:    Physical Exam  Constitutional: She appears well-developed and well-nourished. No distress.  HENT:  Nose: Nose normal.  Mouth/Throat: Oropharynx is clear and moist.  Neck: Neck supple. No thyromegaly present.  Cardiovascular: Normal rate and regular rhythm.   Pulmonary/Chest: Breath sounds normal. No respiratory  distress. She has no wheezes.  Abdominal: Soft. Bowel sounds are normal. There is no tenderness.  Musculoskeletal: She exhibits no edema or tenderness.  Lymphadenopathy:    She has no cervical adenopathy.  Skin: No rash noted. No erythema.  Psychiatric: She has a normal mood and affect. Her behavior is normal.    BP 120/64 (BP Location: Left Arm, Patient Position: Sitting, Cuff Size: Large)   Pulse 66   Temp 98.6 F (37 C) (Oral)   Resp 16   Ht 5\' 5"  (1.651 m)   Wt 159 lb 3.2 oz (72.2 kg)   SpO2 98%   BMI 26.49 kg/m  Wt Readings from Last 3 Encounters:  01/29/17 159 lb 3.2 oz (72.2 kg)  12/18/16 160 lb 6.4 oz (72.8 kg)  07/31/16  159 lb (72.1 kg)     Lab Results  Component Value Date   WBC 6.5 07/27/2016   HGB 12.8 07/27/2016   HCT 37.5 07/27/2016   PLT 256.0 07/27/2016   GLUCOSE 92 01/25/2017   CHOL 165 01/25/2017   TRIG 140.0 01/25/2017   HDL 50.10 01/25/2017   LDLCALC 87 01/25/2017   ALT 17 01/25/2017   AST 20 01/25/2017   NA 141 01/25/2017   K 4.2 01/25/2017   CL 105 01/25/2017   CREATININE 1.09 01/25/2017   BUN 11 01/25/2017   CO2 29 01/25/2017   TSH 2.83 07/27/2016    Mm Screening Breast Tomo Bilateral  Result Date: 09/18/2016 CLINICAL DATA:  Screening. EXAM: 2D DIGITAL SCREENING BILATERAL MAMMOGRAM WITH CAD AND ADJUNCT TOMO COMPARISON:  Previous exam(s). ACR Breast Density Category c: The breast tissue is heterogeneously dense, which may obscure small masses. FINDINGS: There are no findings suspicious for malignancy. Images were processed with CAD. IMPRESSION: No mammographic evidence of malignancy. A result letter of this screening mammogram will be mailed directly to the patient. RECOMMENDATION: Screening mammogram in one year. (Code:SM-B-01Y) BI-RADS CATEGORY  1: Negative. Electronically Signed   By: Dorise Bullion III M.D   On: 09/18/2016 10:27       Assessment & Plan:   Problem List Items Addressed This Visit    Abnormal chest CT    Worked up by oncology.  Last CT stable.  Discussed f/u with her today.  States they will notify her.        Hypercholesterolemia    On simvastatin.  Low cholesterol diet and exercise.  Follow lipid panel and liver function tests.        Relevant Orders   TSH   CBC with Differential/Platelet   Comprehensive metabolic panel   Lipid panel   Vitamin D deficiency    Follow vitamin D level.  Continue supplements.        Relevant Orders   VITAMIN D 25 Hydroxy (Vit-D Deficiency, Fractures)       Einar Pheasant, MD

## 2017-01-29 NOTE — Assessment & Plan Note (Signed)
Follow vitamin D level.  Continue supplements.   

## 2017-01-29 NOTE — Assessment & Plan Note (Addendum)
Worked up by oncology.  Last CT stable.  Discussed f/u with her today.  States they will notify her.

## 2017-03-17 ENCOUNTER — Other Ambulatory Visit: Payer: Self-pay | Admitting: Internal Medicine

## 2017-04-22 ENCOUNTER — Other Ambulatory Visit: Payer: Self-pay | Admitting: Internal Medicine

## 2017-05-06 ENCOUNTER — Encounter: Payer: Self-pay | Admitting: Internal Medicine

## 2017-05-07 NOTE — Telephone Encounter (Signed)
Please call pt and confirm no fever, headache or joint aches.  If any of these symptoms, she needs to be evaluated.  If rash is persistent, needs to be evaluated.  I am out of the office for part of the week this week, so may need to schedule for work in appt (or acute care appt) to confirm nothing more needs to be done.  Thanks

## 2017-05-07 NOTE — Telephone Encounter (Signed)
Spoke with the patient, no fever, no headaches, no rash.  Just a red dot that looks like a mosquito bit.  She will continue to watch it and let us know if she needs any thing.  thanks

## 2017-07-23 ENCOUNTER — Other Ambulatory Visit: Payer: Self-pay | Admitting: Internal Medicine

## 2017-07-24 ENCOUNTER — Telehealth: Payer: Self-pay

## 2017-07-24 NOTE — Telephone Encounter (Signed)
auth submitted on cover my meds [employer] [id number]  Pending approval

## 2017-07-26 ENCOUNTER — Ambulatory Visit (INDEPENDENT_AMBULATORY_CARE_PROVIDER_SITE_OTHER): Payer: Commercial Managed Care - HMO

## 2017-07-26 ENCOUNTER — Other Ambulatory Visit: Payer: Commercial Managed Care - HMO

## 2017-07-26 VITALS — BP 122/62 | HR 61 | Temp 98.3°F | Resp 14 | Ht 65.0 in | Wt 160.0 lb

## 2017-07-26 DIAGNOSIS — Z0000 Encounter for general adult medical examination without abnormal findings: Secondary | ICD-10-CM

## 2017-07-26 DIAGNOSIS — E7800 Pure hypercholesterolemia, unspecified: Secondary | ICD-10-CM | POA: Diagnosis not present

## 2017-07-26 DIAGNOSIS — E559 Vitamin D deficiency, unspecified: Secondary | ICD-10-CM | POA: Diagnosis not present

## 2017-07-26 LAB — CBC WITH DIFFERENTIAL/PLATELET
BASOS PCT: 0.8 % (ref 0.0–3.0)
Basophils Absolute: 0.1 10*3/uL (ref 0.0–0.1)
Eosinophils Absolute: 0.4 10*3/uL (ref 0.0–0.7)
Eosinophils Relative: 5.6 % — ABNORMAL HIGH (ref 0.0–5.0)
HEMATOCRIT: 39.1 % (ref 36.0–46.0)
HEMOGLOBIN: 12.9 g/dL (ref 12.0–15.0)
LYMPHS PCT: 16.4 % (ref 12.0–46.0)
Lymphs Abs: 1.2 10*3/uL (ref 0.7–4.0)
MCHC: 32.9 g/dL (ref 30.0–36.0)
MCV: 92.1 fl (ref 78.0–100.0)
MONOS PCT: 8.1 % (ref 3.0–12.0)
Monocytes Absolute: 0.6 10*3/uL (ref 0.1–1.0)
Neutro Abs: 5.1 10*3/uL (ref 1.4–7.7)
Neutrophils Relative %: 69.1 % (ref 43.0–77.0)
Platelets: 252 10*3/uL (ref 150.0–400.0)
RBC: 4.25 Mil/uL (ref 3.87–5.11)
RDW: 12.9 % (ref 11.5–15.5)
WBC: 7.4 10*3/uL (ref 4.0–10.5)

## 2017-07-26 LAB — LIPID PANEL
CHOLESTEROL: 176 mg/dL (ref 0–200)
HDL: 44.1 mg/dL (ref >39.00)
LDL Cholesterol: 108 mg/dL — ABNORMAL HIGH (ref 0–99)
NONHDL: 132.07
Total CHOL/HDL Ratio: 4
Triglycerides: 121 mg/dL (ref 0.0–149.0)
VLDL: 24.2 mg/dL (ref 0.0–40.0)

## 2017-07-26 LAB — COMPREHENSIVE METABOLIC PANEL
ALBUMIN: 4.1 g/dL (ref 3.5–5.2)
ALK PHOS: 63 U/L (ref 39–117)
ALT: 17 U/L (ref 0–35)
AST: 19 U/L (ref 0–37)
BILIRUBIN TOTAL: 0.5 mg/dL (ref 0.2–1.2)
BUN: 15 mg/dL (ref 6–23)
CALCIUM: 9.6 mg/dL (ref 8.4–10.5)
CHLORIDE: 104 meq/L (ref 96–112)
CO2: 30 mEq/L (ref 19–32)
CREATININE: 1.06 mg/dL (ref 0.40–1.20)
GFR: 53.28 mL/min — ABNORMAL LOW (ref >60.00)
Glucose, Bld: 87 mg/dL (ref 70–99)
POTASSIUM: 4.5 meq/L (ref 3.5–5.1)
Sodium: 141 mEq/L (ref 135–145)
TOTAL PROTEIN: 6.8 g/dL (ref 6.0–8.3)

## 2017-07-26 LAB — TSH: TSH: 2.09 u[IU]/mL (ref 0.35–4.50)

## 2017-07-26 LAB — VITAMIN D 25 HYDROXY (VIT D DEFICIENCY, FRACTURES): VITD: 40.57 ng/mL (ref 30.00–100.00)

## 2017-07-26 NOTE — Patient Instructions (Addendum)
  Ms. Kristin Phelps , Thank you for taking time to come for your Medicare Wellness Visit. I appreciate your ongoing commitment to your health goals. Please review the following plan we discussed and let me know if I can assist you in the future.   Follow up with Dr. Nicki Reaper as needed.    Bring a copy of your [place] and/or Living Will to be scanned into chart.  Have a great day!  These are the goals we discussed: Goals    . Increase lean proteins          Low carb foods      . Increase water intake          Stay hydrated       This is a list of the screening recommended for you and due dates:  Health Maintenance  Topic Date Due  . Tetanus Vaccine  07/16/1958  . Flu Shot  01/24/2018*  . Pneumonia vaccines (1 of 2 - PCV13) 03/28/2025*  . Mammogram  09/18/2017  . DEXA scan (bone density measurement)  Completed  *Topic was postponed. The date shown is not the original due date.

## 2017-07-26 NOTE — Progress Notes (Signed)
Subjective:   Kristin Phelps is a 76 y.o. female who presents for an Initial Medicare Annual Wellness Visit.  Review of Systems    No ROS.  Medicare Wellness Visit. Additional risk factors are reflected in the social history.  Cardiac Risk Factors include: advanced age (>52men, >25 women)     Objective:    Today's Vitals   07/26/17 0850  BP: 122/62  Pulse: 61  Resp: 14  Temp: 98.3 F (36.8 C)  TempSrc: Oral  SpO2: 98%  Weight: 160 lb (72.6 kg)  Height: 5\' 5"  (1.651 m)   Body mass index is 26.63 kg/m.   Current Medications (verified) Outpatient Encounter Prescriptions as of 07/26/2017  Medication Sig  . Cholecalciferol (VITAMIN D-3) 1000 UNITS CAPS Take 1 capsule by mouth daily.  Marland Kitchen estropipate (OGEN) 0.75 MG tablet TAKE ONE-HALF TABLET BY MOUTH ONCE DAILY  . medroxyPROGESTERone (PROVERA) 2.5 MG tablet TAKE ONE TABLET BY MOUTH ONCE DAILY  . polycarbophil (FIBERCON) 625 MG tablet Take 625 mg by mouth daily.  . simvastatin (ZOCOR) 20 MG tablet TAKE 1 TABLET BY MOUTH ONCE DAILY   No facility-administered encounter medications on file as of 07/26/2017.     Allergies (verified) Sulfa antibiotics and Triamcinolone   History: Past Medical History:  Diagnosis Date  . Arthritis   . Cancer ([place])    basal cell  . Hyperlipidemia   . Nephrolithiasis   . Postmenopausal HRT (hormone replacement therapy)   . Pulmonary nodules   . Raynaud's phenomenon   . Renal stone    Past Surgical History:  Procedure Laterality Date  . anal fissure surgery  9/06  . LITHOTRIPSY    . TONSILLECTOMY     Family History  Problem Relation Age of Onset  . Breast cancer Mother        died age 70  . Heart disease Father        s/p CABG  . Aneurysm Father   . Breast cancer Unknown        maternal niece  . Colon cancer Neg Hx    Social History   Occupational History  .  [employer]   Social History Main Topics  . Smoking status: Never Smoker  . Smokeless tobacco:  Never Used  . Alcohol use No  . Drug use: No  . Sexual activity: Not Currently    Tobacco Counseling Counseling given: Not Answered   Activities of Daily Living In your present state of health, do you have any difficulty performing the following activities: 07/26/2017 07/31/2016  Hearing? N Y  Comment - tinnitus  Vision? N Y  Comment - cataracts  Difficulty concentrating or making decisions? N N  Walking or climbing stairs? N N  Dressing or bathing? N N  Doing errands, shopping? N N  Preparing Food and eating ? N -  Using the Toilet? N -  In the past six months, have you accidently leaked urine? N -  Do you have problems with loss of bowel control? N -  Managing your Medications? N -  Managing your Finances? N -  Housekeeping or managing your Housekeeping? N -  Some recent data might be hidden    Immunizations and Health Maintenance  There is no immunization history on file for this patient. Health Maintenance Due  Topic Date Due  . Samul Dada  07/16/1960    Patient Care Team: Einar Pheasant, MD as PCP - General (Internal Medicine) Einar Pheasant, MD (Internal Medicine)  Indicate any  recent Medical Services you may have received from other than [facility] providers in the past year (date may be approximate).     Assessment:   This is a routine wellness examination for Kristin Phelps. The goal of the wellness visit is to assist the patient how to close the gaps in care and create a preventative care plan for the patient.   The roster of all physicians providing medical care to patient is listed in the Snapshot section of the chart.  Taking calcium VIT D as appropriate/Osteoporosis risk reviewed.    Safety issues reviewed; Smoke and carbon monoxide detectors in the home. No firearms in the home.  Wears seatbelts when driving or riding with others. Patient does wear sunscreen or protective clothing when in direct sunlight. No violence in the home.  Patient is alert, normal  appearance, oriented to person/place/and time.  Correctly identified the president of the [place], recall of 3/3 words, and performing simple calculations. Displays appropriate judgement and can read correct time from watch face.   No new identified risk were noted.  No failures at ADL's or IADL's.    BMI- discussed the importance of a healthy diet, water intake and the benefits of aerobic exercise. Educational material provided.   24 hour diet recall: Breakfast: oatmeal, toast with honey Lunch: yogurt, chicken sandwich Dinner: chicken, green vegetable  Daily fluid intake: 1 cups of caffeine, 1 cups of water  Dental- every 6 months.    Sleep patterns- Sleeps 7-8 hours at night.  Wakes feeling rested.  TDAP vaccine deferred per patient preference.  Follow up with insurance.  Educational material provided.  Preexisting labs completed today.  Patient Concerns: None at this time. Follow up with PCP as needed.  Hearing/Vision screen Hearing Screening Comments: Patient is able to hear conversational tones without difficulty.  No issues reported.   Vision Screening Comments: Followed by Dr. Tommy Rain[facility]) Wears corrective lenses Last OV 09/2016 Cataract extraction, bilateral Visual acuity not assessed per patient preference since they have regular follow up with the ophthalmologist  Dietary issues and exercise activities discussed: Current Exercise Habits: Home exercise routine, Time (Minutes): 20, Frequency (Times/Week): 4, Weekly Exercise (Minutes/Week): 80, Intensity: Moderate  Goals    . Increase lean proteins          Low carb foods      . Increase water intake          Stay hydrated      Depression Screen PHQ 2/9 Scores 07/26/2017 07/31/2016 07/21/2015 03/25/2014 12/30/2012  PHQ - 2 Score 0 0 0 0 0    Fall Risk Fall Risk  07/26/2017 07/31/2016 07/21/2015 03/25/2014 12/30/2012  Falls in the past year? No No No No No    Cognitive Function: MMSE - Mini Mental State  Exam 07/26/2017  Orientation to time 5  Orientation to Place 5  Registration 3  Attention/ Calculation 5  Recall 3  Language- name 2 objects 2  Language- repeat 1  Language- follow 3 step command 3  Language- read & follow direction 1  Write a sentence 1  Copy design 1  Total score 30        Screening Tests Health Maintenance  Topic Date Due  . TETANUS/TDAP  07/16/1960  . INFLUENZA VACCINE  01/24/2018 (Originally 07/03/2017)  . PNA vac Low Risk Adult (1 of 2 - PCV13) 03/28/2025 (Originally 07/16/2004)  . MAMMOGRAM  09/18/2017  . DEXA SCAN  Completed      Plan:    End of life  planning; Advance aging; Advanced directives discussed. Copy of current HCPOA/Living Will requested.    I have personally reviewed and noted the following in the patient's chart:   . Medical and social history . Use of alcohol, tobacco or illicit drugs  . Current medications and supplements . Functional ability and status . Nutritional status . Physical activity . Advanced directives . List of other physicians . Hospitalizations, surgeries, and ER visits in previous 12 months . Vitals . Screenings to include cognitive, depression, and falls . Referrals and appointments  In addition, I have reviewed and discussed with patient certain preventive protocols, quality metrics, and best practice recommendations. A written personalized care plan for preventive services as well as general preventive health recommendations were provided to patient.     Varney Biles, LPN   [id number]

## 2017-07-26 NOTE — Progress Notes (Signed)
Care was provided under my supervision. I agree with the management as indicated in the note.  Glendon Fiser DO  

## 2017-07-29 NOTE — Telephone Encounter (Signed)
Fax received from [facility] on 07/25/17. Approved estropipate until 07/26/2019

## 2017-07-30 ENCOUNTER — Encounter: Payer: Self-pay | Admitting: Internal Medicine

## 2017-07-30 ENCOUNTER — Ambulatory Visit (INDEPENDENT_AMBULATORY_CARE_PROVIDER_SITE_OTHER): Payer: Commercial Managed Care - HMO | Admitting: Internal Medicine

## 2017-07-30 VITALS — BP 128/68 | HR 63 | Temp 98.4°F | Resp 12 | Ht 65.0 in | Wt 158.8 lb

## 2017-07-30 DIAGNOSIS — R938 Abnormal findings on diagnostic imaging of other specified body structures: Secondary | ICD-10-CM | POA: Diagnosis not present

## 2017-07-30 DIAGNOSIS — E559 Vitamin D deficiency, unspecified: Secondary | ICD-10-CM

## 2017-07-30 DIAGNOSIS — Z1231 Encounter for screening mammogram for malignant neoplasm of breast: Secondary | ICD-10-CM

## 2017-07-30 DIAGNOSIS — Z0000 Encounter for general adult medical examination without abnormal findings: Secondary | ICD-10-CM | POA: Diagnosis not present

## 2017-07-30 DIAGNOSIS — R9389 Abnormal findings on diagnostic imaging of other specified body structures: Secondary | ICD-10-CM

## 2017-07-30 DIAGNOSIS — E7800 Pure hypercholesterolemia, unspecified: Secondary | ICD-10-CM

## 2017-07-30 DIAGNOSIS — Z1239 Encounter for other screening for malignant neoplasm of breast: Secondary | ICD-10-CM

## 2017-07-30 MED ORDER — MEDROXYPROGESTERONE ACETATE 2.5 MG PO TABS: 2.5000 mg | ORAL_TABLET | Freq: Every day | ORAL | 1 refills | Status: DC

## 2017-07-30 MED ORDER — SIMVASTATIN 20 MG PO TABS: 20.0000 mg | ORAL_TABLET | Freq: Every day | ORAL | 1 refills | Status: DC

## 2017-07-30 MED ORDER — ESTROPIPATE 0.75 MG PO TABS: 0.3750 mg | ORAL_TABLET | Freq: Every day | ORAL | 0 refills | Status: DC

## 2017-07-30 NOTE — Progress Notes (Signed)
Pre-visit discussion using our clinic review tool. No additional management support is needed unless otherwise documented below in the visit note.  

## 2017-07-30 NOTE — Progress Notes (Signed)
Patient ID: Kristin Phelps, female   DOB: Mar 21, 1941, 76 y.o.   MRN: [id number]   Subjective:    Patient ID: Kristin Phelps, female    DOB: [birth year], 76 y.o.   MRN: [id number]  HPI  Patient here for her physical exam.  She reports she is doing relatively well.  Breathing stable.  No increased cough or congestion.  Having some teeth issues.  Planning to have a crown placed tomorrow.  No chest pain.  No acid reflux.  No abdominal pain.  Bowels moving.  Discussed previous ct scan and f/u.  She declines f/u at this time.  States she feels good and desires no further testing or intervention.     Past Medical History:  Diagnosis Date  . Arthritis   . Cancer ([place])    basal cell  . Hyperlipidemia   . Nephrolithiasis   . Postmenopausal HRT (hormone replacement therapy)   . Pulmonary nodules   . Raynaud's phenomenon   . Renal stone    Past Surgical History:  Procedure Laterality Date  . anal fissure surgery  9/06  . LITHOTRIPSY    . TONSILLECTOMY     Family History  Problem Relation Age of Onset  . Breast cancer Mother        died age 50  . Heart disease Father        s/p CABG  . Aneurysm Father   . Breast cancer Unknown        maternal niece  . Colon cancer Neg Hx    Social History   Social History  . Marital status: Married    Spouse name: Gershon Mussel  . Number of children: 2  . Years of education: N/A   Occupational History  .  [employer]   Social History Main Topics  . Smoking status: Never Smoker  . Smokeless tobacco: Never Used  . Alcohol use No  . Drug use: No  . Sexual activity: Not Currently   Other Topics Concern  . None   Social History Narrative  . None    Outpatient Encounter Prescriptions as of 07/30/2017  Medication Sig  . Cholecalciferol (VITAMIN D-3) 1000 UNITS CAPS Take 1 capsule by mouth daily.  Marland Kitchen estropipate (OGEN) 0.75 MG tablet Take 0.5 tablets (0.375 mg total) by mouth daily.  . medroxyPROGESTERone (PROVERA) 2.5 MG tablet  Take 1 tablet (2.5 mg total) by mouth daily.  . polycarbophil (FIBERCON) 625 MG tablet Take 625 mg by mouth daily.  . simvastatin (ZOCOR) 20 MG tablet Take 1 tablet (20 mg total) by mouth daily.  . [DISCONTINUED] estropipate (OGEN) 0.75 MG tablet TAKE ONE-HALF TABLET BY MOUTH ONCE DAILY  . [DISCONTINUED] medroxyPROGESTERone (PROVERA) 2.5 MG tablet TAKE ONE TABLET BY MOUTH ONCE DAILY  . [DISCONTINUED] simvastatin (ZOCOR) 20 MG tablet TAKE 1 TABLET BY MOUTH ONCE DAILY   No facility-administered encounter medications on file as of 07/30/2017.     Review of Systems  Constitutional: Negative for appetite change and unexpected weight change.  HENT: Negative for congestion and sinus pressure.   Eyes: Negative for pain and visual disturbance.  Respiratory: Negative for cough, chest tightness and shortness of breath.   Cardiovascular: Negative for chest pain, palpitations and leg swelling.  Gastrointestinal: Negative for abdominal pain, diarrhea, nausea and vomiting.  Genitourinary: Negative for difficulty urinating and dysuria.  Musculoskeletal: Negative for back pain and joint swelling.  Skin: Negative for color change and rash.  Neurological: Negative for dizziness, light-headedness and headaches.  Hematological: Negative for adenopathy. Does not bruise/bleed easily.  Psychiatric/Behavioral: Negative for agitation and dysphoric mood.       Objective:     Blood pressure rechecked by me:  128/68  Physical Exam  Constitutional: She is oriented to person, place, and time. She appears well-developed and well-nourished. No distress.  HENT:  Nose: Nose normal.  Mouth/Throat: Oropharynx is clear and moist.  Eyes: Right eye exhibits no discharge. Left eye exhibits no discharge. No scleral icterus.  Neck: Neck supple. No thyromegaly present.  Cardiovascular: Normal rate and regular rhythm.   Pulmonary/Chest: Breath sounds normal. No accessory muscle usage. No tachypnea. No respiratory distress.  She has no decreased breath sounds. She has no wheezes. She has no rhonchi. Right breast exhibits no inverted nipple, no mass, no nipple discharge and no tenderness (no axillary adenopathy). Left breast exhibits no inverted nipple, no mass, no nipple discharge and no tenderness (no axilarry adenopathy).  Abdominal: Soft. Bowel sounds are normal. There is no tenderness.  Musculoskeletal: She exhibits no edema or tenderness.  Lymphadenopathy:    She has no cervical adenopathy.  Neurological: She is alert and oriented to person, place, and time.  Skin: Skin is warm. No rash noted. No erythema.  Psychiatric: She has a normal mood and affect. Her behavior is normal.    BP 128/68   Pulse 63   Temp 98.4 F (36.9 C) (Oral)   Resp 12   Ht 5\' 5"  (1.651 m)   Wt 158 lb 12.8 oz (72 kg)   SpO2 98%   BMI 26.43 kg/m  Wt Readings from Last 3 Encounters:  07/30/17 158 lb 12.8 oz (72 kg)  07/26/17 160 lb (72.6 kg)  01/29/17 159 lb 3.2 oz (72.2 kg)     Lab Results  Component Value Date   WBC 7.4 07/26/2017   HGB 12.9 07/26/2017   HCT 39.1 07/26/2017   PLT 252.0 07/26/2017   GLUCOSE 87 07/26/2017   CHOL 176 07/26/2017   TRIG 121.0 07/26/2017   HDL 44.10 07/26/2017   LDLCALC 108 (H) 07/26/2017   ALT 17 07/26/2017   AST 19 07/26/2017   NA 141 07/26/2017   K 4.5 07/26/2017   CL 104 07/26/2017   CREATININE 1.06 07/26/2017   BUN 15 07/26/2017   CO2 30 07/26/2017   TSH 2.09 07/26/2017    Mm Screening Breast Tomo Bilateral  Result Date: 09/18/2016 CLINICAL DATA:  Screening. EXAM: 2D DIGITAL SCREENING BILATERAL MAMMOGRAM WITH CAD AND ADJUNCT TOMO COMPARISON:  Previous exam(s). ACR Breast Density Category c: The breast tissue is heterogeneously dense, which may obscure small masses. FINDINGS: There are no findings suspicious for malignancy. Images were processed with CAD. IMPRESSION: No mammographic evidence of malignancy. A result letter of this screening mammogram will be mailed directly to  the patient. RECOMMENDATION: Screening mammogram in one year. (Code:SM-B-01Y) BI-RADS CATEGORY  1: Negative. Electronically Signed   By: Dorise Bullion III M.D   On: 09/18/2016 10:27       Assessment & Plan:   Problem List Items Addressed This Visit    Abnormal chest CT    Worked up by oncology.  Last CT stable.  Discussed with her today.  She desires no further work up or follow up.        Health care maintenance    Physical today 07/30/17.  Has declined f/u colonoscopy.  Mammogram 09/18/16 - Birads I.  Scheduled for f/u mammogram.        Hypercholesterolemia  On simvastatin.  Low cholesterol diet and exercise.  Follow lipid panel and liver function tests.        Relevant Medications   simvastatin (ZOCOR) 20 MG tablet   Other Relevant Orders   Lipid panel   Comprehensive metabolic panel   Vitamin D deficiency    Continue vitamin D supplements.  Follow vitamin D level.         Other Visit Diagnoses    Screening for breast cancer    -  Primary   Relevant Orders   MM DIGITAL SCREENING BILATERAL       Einar Pheasant, MD

## 2017-07-30 NOTE — Assessment & Plan Note (Signed)
Physical today 07/30/17.  Has declined f/u colonoscopy.  Mammogram 09/18/16 - Birads I.  Scheduled for f/u mammogram.

## 2017-07-31 ENCOUNTER — Encounter: Payer: Self-pay | Admitting: Internal Medicine

## 2017-07-31 NOTE — Assessment & Plan Note (Signed)
Continue vitamin D supplements.  Follow vitamin D level.   

## 2017-07-31 NOTE — Assessment & Plan Note (Signed)
On simvastatin.  Low cholesterol diet and exercise.  Follow lipid panel and liver function tests.   

## 2017-07-31 NOTE — Assessment & Plan Note (Signed)
Worked up by oncology.  Last CT stable.  Discussed with her today.  She desires no further work up or follow up.

## 2017-08-22 DIAGNOSIS — H5213 Myopia, bilateral: Secondary | ICD-10-CM | POA: Diagnosis not present

## 2017-09-24 ENCOUNTER — Ambulatory Visit
Admission: RE | Admit: 2017-09-24 | Discharge: 2017-09-24 | Disposition: A | Discharge status: Home / Self Care | Payer: Commercial Managed Care - HMO | Source: Ambulatory Visit | Attending: Internal Medicine | Admitting: Internal Medicine

## 2017-09-24 DIAGNOSIS — Z1231 Encounter for screening mammogram for malignant neoplasm of breast: Secondary | ICD-10-CM | POA: Diagnosis not present

## 2017-09-24 DIAGNOSIS — Z1239 Encounter for other screening for malignant neoplasm of breast: Secondary | ICD-10-CM

## 2018-02-04 ENCOUNTER — Encounter: Payer: Self-pay | Admitting: Internal Medicine

## 2018-02-04 ENCOUNTER — Other Ambulatory Visit (INDEPENDENT_AMBULATORY_CARE_PROVIDER_SITE_OTHER): Payer: Medicare HMO

## 2018-02-04 DIAGNOSIS — E7800 Pure hypercholesterolemia, unspecified: Secondary | ICD-10-CM | POA: Diagnosis not present

## 2018-02-04 LAB — COMPREHENSIVE METABOLIC PANEL
ALK PHOS: 68 U/L (ref 39–117)
ALT: 20 U/L (ref 0–35)
AST: 23 U/L (ref 0–37)
Albumin: 3.9 g/dL (ref 3.5–5.2)
BILIRUBIN TOTAL: 0.5 mg/dL (ref 0.2–1.2)
BUN: 17 mg/dL (ref 6–23)
CALCIUM: 9.5 mg/dL (ref 8.4–10.5)
CO2: 30 mEq/L (ref 19–32)
Chloride: 104 mEq/L (ref 96–112)
Creatinine, Ser: 1.05 mg/dL (ref 0.40–1.20)
GFR: 53.8 mL/min — AB (ref >60.00)
Glucose, Bld: 83 mg/dL (ref 70–99)
POTASSIUM: 4 meq/L (ref 3.5–5.1)
Sodium: 139 mEq/L (ref 135–145)
Total Protein: 6.6 g/dL (ref 6.0–8.3)

## 2018-02-04 LAB — LIPID PANEL
Cholesterol: 150 mg/dL (ref 0–200)
HDL: 51.5 mg/dL (ref >39.00)
LDL Cholesterol: 79 mg/dL (ref 0–99)
NonHDL: 98.22
TRIGLYCERIDES: 95 mg/dL (ref 0.0–149.0)
Total CHOL/HDL Ratio: 3
VLDL: 19 mg/dL (ref 0.0–40.0)

## 2018-02-07 ENCOUNTER — Ambulatory Visit (INDEPENDENT_AMBULATORY_CARE_PROVIDER_SITE_OTHER): Payer: Medicare HMO | Admitting: Internal Medicine

## 2018-02-07 ENCOUNTER — Encounter: Payer: Self-pay | Admitting: Internal Medicine

## 2018-02-07 DIAGNOSIS — E559 Vitamin D deficiency, unspecified: Secondary | ICD-10-CM

## 2018-02-07 DIAGNOSIS — R9389 Abnormal findings on diagnostic imaging of other specified body structures: Secondary | ICD-10-CM

## 2018-02-07 DIAGNOSIS — E7800 Pure hypercholesterolemia, unspecified: Secondary | ICD-10-CM | POA: Diagnosis not present

## 2018-02-07 MED ORDER — ESTROPIPATE 0.75 MG PO TABS: 0.3750 mg | ORAL_TABLET | Freq: Every day | ORAL | 0 refills | Status: DC

## 2018-02-07 MED ORDER — MEDROXYPROGESTERONE ACETATE 2.5 MG PO TABS: 2.5000 mg | ORAL_TABLET | Freq: Every day | ORAL | 1 refills | Status: DC

## 2018-02-07 NOTE — Progress Notes (Signed)
Patient ID: Kristin Phelps, female   DOB: October 02, 1941, 77 y.o.   MRN: [id number]   Subjective:    Patient ID: Kristin Phelps, female    DOB: [birth year], 77 y.o.   MRN: [id number]  HPI  Patient here for a scheduled follow up.  She reports she is doing well.  Feels good.  Tries to stay active.  No chest pain.  No sob.  No acid reflux.  No abdominal pain.  Bowels moving.  No urine change.  Discussed f/u CT.  She declines.  Overall she feels she is doing well.     Past Medical History:  Diagnosis Date  . Arthritis   . Cancer ([place])    basal cell  . Hyperlipidemia   . Nephrolithiasis   . Postmenopausal HRT (hormone replacement therapy)   . Pulmonary nodules   . Raynaud's phenomenon   . Renal stone    Past Surgical History:  Procedure Laterality Date  . anal fissure surgery  9/06  . LITHOTRIPSY    . TONSILLECTOMY     Family History  Problem Relation Age of Onset  . Breast cancer Mother        died age 27  . Heart disease Father        s/p CABG  . Aneurysm Father   . Breast cancer Unknown        maternal niece  . Colon cancer Neg Hx    Social History   Socioeconomic History  . Marital status: Married    Spouse name: Gershon Mussel  . Number of children: 2  . Years of education: None  . Highest education level: None  Social Needs  . Financial resource strain: None  . Food insecurity - worry: None  . Food insecurity - inability: None  . Transportation needs - medical: None  . Transportation needs - non-medical: None  Occupational History    Employer: [employer]  Tobacco Use  . Smoking status: Never Smoker  . Smokeless tobacco: Never Used  Substance and Sexual Activity  . Alcohol use: No    Alcohol/week: 0.0 oz  . Drug use: No  . Sexual activity: Not Currently  Other Topics Concern  . None  Social History Narrative  . None    Outpatient Encounter Medications as of 02/07/2018  Medication Sig  . Cholecalciferol (VITAMIN D-3) 1000 UNITS CAPS Take 1  capsule by mouth daily.  Marland Kitchen estropipate (OGEN) 0.75 MG tablet Take 0.5 tablets (0.375 mg total) by mouth daily.  . medroxyPROGESTERone (PROVERA) 2.5 MG tablet Take 1 tablet (2.5 mg total) by mouth daily.  . polycarbophil (FIBERCON) 625 MG tablet Take 625 mg by mouth daily.  . simvastatin (ZOCOR) 20 MG tablet Take 1 tablet (20 mg total) by mouth daily.  . [DISCONTINUED] estropipate (OGEN) 0.75 MG tablet Take 0.5 tablets (0.375 mg total) by mouth daily.  . [DISCONTINUED] medroxyPROGESTERone (PROVERA) 2.5 MG tablet Take 1 tablet (2.5 mg total) by mouth daily.   No facility-administered encounter medications on file as of 02/07/2018.     Review of Systems  Constitutional: Negative for appetite change and unexpected weight change.  HENT: Negative for congestion and sinus pressure.   Respiratory: Negative for cough, chest tightness and shortness of breath.   Cardiovascular: Negative for chest pain, palpitations and leg swelling.  Gastrointestinal: Negative for abdominal pain, diarrhea, nausea and vomiting.  Genitourinary: Negative for difficulty urinating and dysuria.  Musculoskeletal: Negative for joint swelling and myalgias.  Skin: Negative for color change  and rash.  Neurological: Negative for dizziness, light-headedness and headaches.  Psychiatric/Behavioral: Negative for agitation and dysphoric mood.       Objective:    Physical Exam  Constitutional: She appears well-developed and well-nourished. No distress.  HENT:  Nose: Nose normal.  Mouth/Throat: Oropharynx is clear and moist.  Neck: Neck supple. No thyromegaly present.  Cardiovascular: Normal rate and regular rhythm.  Pulmonary/Chest: Breath sounds normal. No respiratory distress. She has no wheezes.  Abdominal: Soft. Bowel sounds are normal. There is no tenderness.  Musculoskeletal: She exhibits no edema or tenderness.  Lymphadenopathy:    She has no cervical adenopathy.  Skin: No rash noted. No erythema.  Psychiatric: She  has a normal mood and affect. Her behavior is normal.    BP (!) 106/58 (BP Location: Left Arm, Patient Position: Sitting, Cuff Size: Normal)   Pulse 67   Temp 98.6 F (37 C) (Oral)   Resp 18   Wt 157 lb 9.6 oz (71.5 kg)   SpO2 97%   BMI 26.23 kg/m  Wt Readings from Last 3 Encounters:  02/07/18 157 lb 9.6 oz (71.5 kg)  07/30/17 158 lb 12.8 oz (72 kg)  07/26/17 160 lb (72.6 kg)     Lab Results  Component Value Date   WBC 7.4 07/26/2017   HGB 12.9 07/26/2017   HCT 39.1 07/26/2017   PLT 252.0 07/26/2017   GLUCOSE 83 02/04/2018   CHOL 150 02/04/2018   TRIG 95.0 02/04/2018   HDL 51.50 02/04/2018   LDLCALC 79 02/04/2018   ALT 20 02/04/2018   AST 23 02/04/2018   NA 139 02/04/2018   K 4.0 02/04/2018   CL 104 02/04/2018   CREATININE 1.05 02/04/2018   BUN 17 02/04/2018   CO2 30 02/04/2018   TSH 2.09 07/26/2017    Mm Digital Screening Bilateral  Result Date: 09/24/2017 CLINICAL DATA:  Screening. EXAM: DIGITAL SCREENING BILATERAL MAMMOGRAM WITH CAD COMPARISON:  Previous exam(s). ACR Breast Density Category c: The breast tissue is heterogeneously dense, which may obscure small masses. FINDINGS: There are no findings suspicious for malignancy. Images were processed with CAD. IMPRESSION: No mammographic evidence of malignancy. A result letter of this screening mammogram will be mailed directly to the patient. RECOMMENDATION: Screening mammogram in one year. (Code:SM-B-01Y) BI-RADS CATEGORY  1: Negative. Electronically Signed   By: Ammie Ferrier M.D.   On: 09/24/2017 13:18       Assessment & Plan:   Problem List Items Addressed This Visit    Abnormal chest CT    Worked up by oncology.  Last CT stable.  Discussed with her again today regarding f/u CT.  She declines.        Hypercholesterolemia    On simvastatin.  Low cholesterol diet and exercise.  Follow lipid panel and liver function tests.        Relevant Orders   CBC with Differential/Platelet   Hepatic function  panel   Lipid panel   TSH   Basic metabolic panel   Vitamin D deficiency    Check vitamin D level.        Relevant Orders   VITAMIN D 25 Hydroxy (Vit-D Deficiency, Fractures)       Einar Pheasant, MD

## 2018-02-09 ENCOUNTER — Encounter: Payer: Self-pay | Admitting: Internal Medicine

## 2018-02-09 NOTE — Assessment & Plan Note (Signed)
Worked up by oncology.  Last CT stable.  Discussed with her again today regarding f/u CT.  She declines.

## 2018-02-09 NOTE — Assessment & Plan Note (Signed)
Check vitamin D level 

## 2018-02-09 NOTE — Assessment & Plan Note (Signed)
On simvastatin.  Low cholesterol diet and exercise.  Follow lipid panel and liver function tests.   

## 2018-04-17 ENCOUNTER — Other Ambulatory Visit: Payer: Self-pay | Admitting: Internal Medicine

## 2018-07-28 ENCOUNTER — Ambulatory Visit: Payer: Commercial Managed Care - HMO

## 2018-08-08 ENCOUNTER — Other Ambulatory Visit (INDEPENDENT_AMBULATORY_CARE_PROVIDER_SITE_OTHER): Payer: Medicare HMO

## 2018-08-08 DIAGNOSIS — E559 Vitamin D deficiency, unspecified: Secondary | ICD-10-CM

## 2018-08-08 DIAGNOSIS — E7800 Pure hypercholesterolemia, unspecified: Secondary | ICD-10-CM

## 2018-08-08 LAB — CBC WITH DIFFERENTIAL/PLATELET
BASOS PCT: 1 % (ref 0.0–3.0)
Basophils Absolute: 0.1 10*3/uL (ref 0.0–0.1)
EOS ABS: 0.5 10*3/uL (ref 0.0–0.7)
Eosinophils Relative: 7.3 % — ABNORMAL HIGH (ref 0.0–5.0)
HCT: 38.2 % (ref 36.0–46.0)
HEMOGLOBIN: 12.9 g/dL (ref 12.0–15.0)
LYMPHS PCT: 20.4 % (ref 12.0–46.0)
Lymphs Abs: 1.3 10*3/uL (ref 0.7–4.0)
MCHC: 33.8 g/dL (ref 30.0–36.0)
MCV: 89.4 fl (ref 78.0–100.0)
Monocytes Absolute: 0.5 10*3/uL (ref 0.1–1.0)
Monocytes Relative: 7.9 % (ref 3.0–12.0)
Neutro Abs: 4.2 10*3/uL (ref 1.4–7.7)
Neutrophils Relative %: 63.4 % (ref 43.0–77.0)
Platelets: 242 10*3/uL (ref 150.0–400.0)
RBC: 4.28 Mil/uL (ref 3.87–5.11)
RDW: 12.9 % (ref 11.5–15.5)
WBC: 6.6 10*3/uL (ref 4.0–10.5)

## 2018-08-08 LAB — VITAMIN D 25 HYDROXY (VIT D DEFICIENCY, FRACTURES): VITD: 39.89 ng/mL (ref 30.00–100.00)

## 2018-08-08 LAB — LIPID PANEL
Cholesterol: 159 mg/dL (ref 0–200)
HDL: 49.6 mg/dL (ref >39.00)
LDL CALC: 88 mg/dL (ref 0–99)
NonHDL: 109.24
TRIGLYCERIDES: 106 mg/dL (ref 0.0–149.0)
Total CHOL/HDL Ratio: 3
VLDL: 21.2 mg/dL (ref 0.0–40.0)

## 2018-08-08 LAB — HEPATIC FUNCTION PANEL
ALK PHOS: 70 U/L (ref 39–117)
ALT: 17 U/L (ref 0–35)
AST: 21 U/L (ref 0–37)
Albumin: 3.9 g/dL (ref 3.5–5.2)
BILIRUBIN DIRECT: 0.1 mg/dL (ref 0.0–0.3)
BILIRUBIN TOTAL: 0.4 mg/dL (ref 0.2–1.2)
TOTAL PROTEIN: 6 g/dL (ref 6.0–8.3)

## 2018-08-08 LAB — BASIC METABOLIC PANEL
BUN: 20 mg/dL (ref 6–23)
CALCIUM: 9.2 mg/dL (ref 8.4–10.5)
CO2: 30 meq/L (ref 19–32)
CREATININE: 1.13 mg/dL (ref 0.40–1.20)
Chloride: 106 mEq/L (ref 96–112)
GFR: 49.36 mL/min — AB (ref >60.00)
GLUCOSE: 92 mg/dL (ref 70–99)
Potassium: 4.3 mEq/L (ref 3.5–5.1)
SODIUM: 142 meq/L (ref 135–145)

## 2018-08-08 LAB — TSH: TSH: 3.93 u[IU]/mL (ref 0.35–4.50)

## 2018-08-11 ENCOUNTER — Encounter: Payer: Self-pay | Admitting: Internal Medicine

## 2018-08-12 ENCOUNTER — Encounter: Payer: Self-pay | Admitting: Internal Medicine

## 2018-08-12 ENCOUNTER — Ambulatory Visit (INDEPENDENT_AMBULATORY_CARE_PROVIDER_SITE_OTHER): Payer: Medicare HMO | Admitting: Internal Medicine

## 2018-08-12 VITALS — BP 126/62 | HR 68 | Temp 98.1°F | Resp 18 | Ht 65.0 in | Wt 161.0 lb

## 2018-08-12 DIAGNOSIS — Z0000 Encounter for general adult medical examination without abnormal findings: Secondary | ICD-10-CM | POA: Diagnosis not present

## 2018-08-12 DIAGNOSIS — H6121 Impacted cerumen, right ear: Secondary | ICD-10-CM | POA: Diagnosis not present

## 2018-08-12 DIAGNOSIS — Z1231 Encounter for screening mammogram for malignant neoplasm of breast: Secondary | ICD-10-CM

## 2018-08-12 DIAGNOSIS — E7800 Pure hypercholesterolemia, unspecified: Secondary | ICD-10-CM

## 2018-08-12 DIAGNOSIS — R944 Abnormal results of kidney function studies: Secondary | ICD-10-CM

## 2018-08-12 DIAGNOSIS — Z79818 Long term (current) use of other agents affecting estrogen receptors and estrogen levels: Secondary | ICD-10-CM

## 2018-08-12 DIAGNOSIS — H9190 Unspecified hearing loss, unspecified ear: Secondary | ICD-10-CM

## 2018-08-12 DIAGNOSIS — Z1239 Encounter for other screening for malignant neoplasm of breast: Secondary | ICD-10-CM

## 2018-08-12 DIAGNOSIS — R079 Chest pain, unspecified: Secondary | ICD-10-CM | POA: Diagnosis not present

## 2018-08-12 DIAGNOSIS — R9389 Abnormal findings on diagnostic imaging of other specified body structures: Secondary | ICD-10-CM | POA: Diagnosis not present

## 2018-08-12 DIAGNOSIS — E559 Vitamin D deficiency, unspecified: Secondary | ICD-10-CM

## 2018-08-12 DIAGNOSIS — Z79899 Other long term (current) drug therapy: Secondary | ICD-10-CM | POA: Insufficient documentation

## 2018-08-12 NOTE — Progress Notes (Addendum)
Patient ID: Kristin Phelps, female   DOB: [birth year], 77 y.o.   MRN: [id number]   Subjective:    Patient ID: Kristin Phelps, female    DOB: January 04, 1941, 77 y.o.   MRN: [id number]  HPI  Patient here for her physical exam.  She reports she is doing relatively well.  Feels good.  Tries to stay active.  No chest pain.  No sob. No acid reflux.  No abdominal pain.  Bowels moving.  Discussed labs.  Discussed further pulmonary w/up.  Declines.  Does report some vaginal discharge - clear.  Discussed further w/up and evaluation.  She declines.  Declines pelvic exam.     Past Medical History:  Diagnosis Date  . Arthritis   . Cancer ([place])    basal cell  . Hyperlipidemia   . Nephrolithiasis   . Postmenopausal HRT (hormone replacement therapy)   . Pulmonary nodules   . Raynaud's phenomenon   . Renal stone    Past Surgical History:  Procedure Laterality Date  . anal fissure surgery  9/06  . LITHOTRIPSY    . TONSILLECTOMY     Family History  Problem Relation Age of Onset  . Breast cancer Mother        died age 19  . Heart disease Father        s/p CABG  . Aneurysm Father   . Breast cancer Unknown        maternal niece  . Colon cancer Neg Hx    Social History   Socioeconomic History  . Marital status: Married    Spouse name: Gershon Mussel  . Number of children: 2  . Years of education: Not on file  . Highest education level: Not on file  Occupational History    Employer: [employer]  Social Needs  . Financial resource strain: Not on file  . Food insecurity:    Worry: Not on file    Inability: Not on file  . Transportation needs:    Medical: Not on file    Non-medical: Not on file  Tobacco Use  . Smoking status: Never Smoker  . Smokeless tobacco: Never Used  Substance and Sexual Activity  . Alcohol use: No    Alcohol/week: 0.0 standard drinks  . Drug use: No  . Sexual activity: Not Currently  Lifestyle  . Physical activity:    Days per week: Not on file   Minutes per session: Not on file  . Stress: Not on file  Relationships  . Social connections:    Talks on phone: Not on file    Gets together: Not on file    Attends religious service: Not on file    Active member of club or organization: Not on file    Attends meetings of clubs or organizations: Not on file    Relationship status: Not on file  Other Topics Concern  . Not on file  Social History Narrative  . Not on file    Outpatient Encounter Medications as of 08/12/2018  Medication Sig  . Cholecalciferol (VITAMIN D-3) 1000 UNITS CAPS Take 1 capsule by mouth daily.  Marland Kitchen estropipate (OGEN) 0.75 MG tablet Take 0.5 tablets (0.375 mg total) by mouth daily.  . medroxyPROGESTERone (PROVERA) 2.5 MG tablet Take 1 tablet (2.5 mg total) by mouth daily.  . polycarbophil (FIBERCON) 625 MG tablet Take 625 mg by mouth daily.  . simvastatin (ZOCOR) 20 MG tablet TAKE 1 TABLET BY MOUTH ONCE DAILY   No facility-administered encounter  medications on file as of 08/12/2018.     Review of Systems  Constitutional: Negative for appetite change and unexpected weight change.  HENT: Negative for congestion and sinus pressure.   Eyes: Negative for pain and visual disturbance.  Respiratory: Negative for cough, chest tightness and shortness of breath.   Cardiovascular: Negative for chest pain, palpitations and leg swelling.  Gastrointestinal: Negative for abdominal pain, diarrhea, nausea and vomiting.  Genitourinary: Negative for difficulty urinating and dysuria.  Musculoskeletal: Negative for joint swelling and myalgias.  Skin: Negative for color change and rash.  Neurological: Negative for dizziness, light-headedness and headaches.  Hematological: Negative for adenopathy. Does not bruise/bleed easily.  Psychiatric/Behavioral: Negative for agitation and dysphoric mood.       Objective:    Physical Exam  Constitutional: She is oriented to person, place, and time. She appears well-developed and  well-nourished. No distress.  HENT:  Nose: Nose normal.  Mouth/Throat: Oropharynx is clear and moist.  Eyes: Right eye exhibits no discharge. Left eye exhibits no discharge. No scleral icterus.  Neck: Neck supple. No thyromegaly present.  Cardiovascular: Normal rate and regular rhythm.  Pulmonary/Chest: Breath sounds normal. No accessory muscle usage. No tachypnea. No respiratory distress. She has no decreased breath sounds. She has no wheezes. She has no rhonchi. Right breast exhibits no inverted nipple, no mass, no nipple discharge and no tenderness (no axillary adenopathy). Left breast exhibits no inverted nipple, no mass, no nipple discharge and no tenderness (no axilarry adenopathy).  Abdominal: Soft. Bowel sounds are normal. There is no tenderness.  Musculoskeletal: She exhibits no edema or tenderness.  Lymphadenopathy:    She has no cervical adenopathy.  Neurological: She is alert and oriented to person, place, and time.  Skin: No rash noted. No erythema.  Psychiatric: She has a normal mood and affect. Her behavior is normal.    BP 126/62 (BP Location: Left Arm, Patient Position: Sitting, Cuff Size: Normal)   Pulse 68   Temp 98.1 F (36.7 C) (Oral)   Resp 18   Ht 5' 5"  (1.651 m)   Wt 161 lb (73 kg)   SpO2 98%   BMI 26.79 kg/m  Wt Readings from Last 3 Encounters:  08/12/18 161 lb (73 kg)  02/07/18 157 lb 9.6 oz (71.5 kg)  07/30/17 158 lb 12.8 oz (72 kg)     Lab Results  Component Value Date   WBC 6.6 08/08/2018   HGB 12.9 08/08/2018   HCT 38.2 08/08/2018   PLT 242.0 08/08/2018   GLUCOSE 92 08/08/2018   CHOL 159 08/08/2018   TRIG 106.0 08/08/2018   HDL 49.60 08/08/2018   LDLCALC 88 08/08/2018   ALT 17 08/08/2018   AST 21 08/08/2018   NA 142 08/08/2018   K 4.3 08/08/2018   CL 106 08/08/2018   CREATININE 1.13 08/08/2018   BUN 20 08/08/2018   CO2 30 08/08/2018   TSH 3.93 08/08/2018    Mm Digital Screening Bilateral  Result Date: 09/24/2017 CLINICAL DATA:   Screening. EXAM: DIGITAL SCREENING BILATERAL MAMMOGRAM WITH CAD COMPARISON:  Previous exam(s). ACR Breast Density Category c: The breast tissue is heterogeneously dense, which may obscure small masses. FINDINGS: There are no findings suspicious for malignancy. Images were processed with CAD. IMPRESSION: No mammographic evidence of malignancy. A result letter of this screening mammogram will be mailed directly to the patient. RECOMMENDATION: Screening mammogram in one year. (Code:SM-B-01Y) BI-RADS CATEGORY  1: Negative. Electronically Signed   By: Ammie Ferrier M.D.   On: 09/24/2017  13:18       Assessment & Plan:   Problem List Items Addressed This Visit    Abnormal chest CT    Previously worked up by oncology.  Last CT stable.  Discussed again with her today regarding further evaluation and f/u CT.  She declines.        Current use of estrogen therapy    Taking qod now.  Discussed the need to continue to try and taper.  She declines.  Wants to remain on current dose.        Decreased hearing    Has been evaluated recently and now has hearing aids.  She declines further evaluation.        Health care maintenance    Physical today 08/12/18.  Declines colonoscopy.  Mammogram 09/24/17 - Birads I.  F/u mammogram ordered.        Hypercholesterolemia    On simvastatin.  Low cholesterol diet and exercise.  Follow lipid panel and liver function tests.        Vitamin D deficiency    Follow vitamin D level.         Other Visit Diagnoses    Breast cancer screening    -  Primary   Relevant Orders   MM 3D SCREEN BREAST BILATERAL   Chest pain, unspecified type       Chest pain intermittently.  EKG - SR with no acute ischemic changes.  Discussed further w/up. She declines.  Wants to monitor.     Relevant Orders   EKG 12-Lead (Completed)   Right ear impacted cerumen       Ear irrigation.    Decreased GFR       Avoid antiinflammatories.  Stay hydrated.  Recheck met b and urinalysis.      Relevant Orders   Urinalysis, Routine w reflex microscopic   Basic Metabolic Panel (BMET)   Routine general medical examination at a health care facility           Einar Pheasant, MD

## 2018-08-12 NOTE — Assessment & Plan Note (Signed)
Taking qod now.  Discussed the need to continue to try and taper.  She declines.  Wants to remain on current dose.

## 2018-08-12 NOTE — Assessment & Plan Note (Signed)
Previously worked up by oncology.  Last CT stable.  Discussed again with her today regarding further evaluation and f/u CT.  She declines.

## 2018-08-12 NOTE — Assessment & Plan Note (Signed)
Has been evaluated recently and now has hearing aids.  She declines further evaluation.

## 2018-08-12 NOTE — Assessment & Plan Note (Signed)
Physical today 08/12/18.  Declines colonoscopy.  Mammogram 09/24/17 - Birads I.  F/u mammogram ordered.

## 2018-08-12 NOTE — Assessment & Plan Note (Signed)
On simvastatin.  Low cholesterol diet and exercise.  Follow lipid panel and liver function tests.   

## 2018-08-16 ENCOUNTER — Encounter: Payer: Self-pay | Admitting: Internal Medicine

## 2018-08-16 NOTE — Assessment & Plan Note (Signed)
Follow vitamin D level.  

## 2018-09-09 ENCOUNTER — Other Ambulatory Visit (INDEPENDENT_AMBULATORY_CARE_PROVIDER_SITE_OTHER): Payer: Medicare HMO

## 2018-09-09 DIAGNOSIS — R944 Abnormal results of kidney function studies: Secondary | ICD-10-CM | POA: Diagnosis not present

## 2018-09-09 LAB — URINALYSIS, ROUTINE W REFLEX MICROSCOPIC
Bilirubin Urine: NEGATIVE
Hgb urine dipstick: NEGATIVE
KETONES UR: NEGATIVE
LEUKOCYTES UA: NEGATIVE
Nitrite: NEGATIVE
PH: 5.5 (ref 5.0–8.0)
RBC / HPF: NONE SEEN (ref 0–?)
Total Protein, Urine: NEGATIVE
URINE GLUCOSE: NEGATIVE
Urobilinogen, UA: 0.2 (ref 0.0–1.0)

## 2018-09-09 LAB — BASIC METABOLIC PANEL
BUN: 16 mg/dL (ref 6–23)
CHLORIDE: 106 meq/L (ref 96–112)
CO2: 27 meq/L (ref 19–32)
CREATININE: 1.1 mg/dL (ref 0.40–1.20)
Calcium: 9.4 mg/dL (ref 8.4–10.5)
GFR: 50.91 mL/min — ABNORMAL LOW (ref >60.00)
GLUCOSE: 82 mg/dL (ref 70–99)
POTASSIUM: 4.4 meq/L (ref 3.5–5.1)
Sodium: 139 mEq/L (ref 135–145)

## 2018-09-10 ENCOUNTER — Encounter: Payer: Self-pay | Admitting: Internal Medicine

## 2018-09-30 ENCOUNTER — Ambulatory Visit
Admission: RE | Admit: 2018-09-30 | Discharge: 2018-09-30 | Disposition: A | Discharge status: Home / Self Care | Payer: Medicare HMO | Source: Ambulatory Visit | Attending: Internal Medicine | Admitting: Internal Medicine

## 2018-09-30 DIAGNOSIS — Z1239 Encounter for other screening for malignant neoplasm of breast: Secondary | ICD-10-CM

## 2018-09-30 DIAGNOSIS — Z1231 Encounter for screening mammogram for malignant neoplasm of breast: Secondary | ICD-10-CM | POA: Insufficient documentation

## 2018-10-13 ENCOUNTER — Other Ambulatory Visit: Payer: Self-pay | Admitting: Internal Medicine

## 2018-10-15 ENCOUNTER — Telehealth: Payer: Self-pay

## 2018-10-15 NOTE — Telephone Encounter (Signed)
Copied from [place] [id number]. Topic: General - Other >> Oct 15, 2018  4:11 PM Carolyn Stare wrote:   [facility] call to say the following med is on manufacture back order and asking for something else    request call back    estropipate (OGEN) 0.75 MG tablet

## 2018-10-16 NOTE — Telephone Encounter (Signed)
Is there an alternative we can use?

## 2018-10-17 NOTE — Telephone Encounter (Signed)
Can see if another pharmacy has the medication.  There are other estrogens that can be used.  Is there one that is preferred by her insurance?  Pharmacy may be able to help with alternative - they have available. (question of estrace, etc).

## 2018-10-17 NOTE — Telephone Encounter (Signed)
Spoke with the pharmacist. She says that the estradiol tablets are probably going to be the most inexpensive equivalent. They told the pt that the closest pharmacy that had her medication was [facility] in [place] and she did not want to drive that far.

## 2018-10-19 NOTE — Telephone Encounter (Signed)
Please confirm with pt that she wants to change and not drive to [place].  If so, would recommend estrace .5mg  (to be taken the same way she is taking her medication).  Confirm if she is taking her estrogen daily or on a different schedule (for example qod, etc).

## 2018-10-20 ENCOUNTER — Other Ambulatory Visit: Payer: Self-pay | Admitting: Internal Medicine

## 2018-10-20 MED ORDER — ESTRADIOL 0.5 MG PO TABS: ORAL_TABLET | ORAL | 2 refills | Status: DC

## 2018-10-20 NOTE — Telephone Encounter (Signed)
Patient stated she would rather not drive to [place] if she does not have to. She is okay with switching. She is taking her estrogen 1/2 tablet every other day.

## 2018-10-20 NOTE — Telephone Encounter (Signed)
Patient is aware. Patient asked how much this would cost. Advised pharmacy could not give me a price without the prescription. Advised that if she could not afford it we may have to complete a PA to get medication covered.

## 2018-10-20 NOTE — Progress Notes (Signed)
rx sent in for estrace .5mg  tablets to take qod #30 with 2 refills.

## 2018-10-20 NOTE — Telephone Encounter (Signed)
I have sent in rx for estrace .5mg  tablet.  She is to take one tablet qod.  We can continue to try to titrate dose down.  (hopefully this will be most equivalent to dose she was on).  Will need to continue her progesterone.

## 2018-11-03 ENCOUNTER — Encounter: Payer: Self-pay | Admitting: Internal Medicine

## 2018-12-23 ENCOUNTER — Ambulatory Visit (INDEPENDENT_AMBULATORY_CARE_PROVIDER_SITE_OTHER): Payer: Medicare HMO | Admitting: Internal Medicine

## 2018-12-23 ENCOUNTER — Encounter: Payer: Self-pay | Admitting: Internal Medicine

## 2018-12-23 DIAGNOSIS — E7800 Pure hypercholesterolemia, unspecified: Secondary | ICD-10-CM | POA: Diagnosis not present

## 2018-12-23 DIAGNOSIS — R9389 Abnormal findings on diagnostic imaging of other specified body structures: Secondary | ICD-10-CM

## 2018-12-23 DIAGNOSIS — R079 Chest pain, unspecified: Secondary | ICD-10-CM

## 2018-12-23 DIAGNOSIS — E559 Vitamin D deficiency, unspecified: Secondary | ICD-10-CM

## 2018-12-23 DIAGNOSIS — Z79899 Other long term (current) drug therapy: Secondary | ICD-10-CM | POA: Diagnosis not present

## 2018-12-23 MED ORDER — ESTRADIOL 1 MG PO TABS: 1.0000 mg | ORAL_TABLET | ORAL | 3 refills | Status: DC

## 2018-12-23 NOTE — Progress Notes (Signed)
Patient ID: Kristin Phelps, female   DOB: 15-Sep-1941, 78 y.o.   MRN: [id number]   Subjective:    Patient ID: Kristin Phelps, female    DOB: July 04, 1941, 78 y.o.   MRN: [id number]  HPI  Patient here for a scheduled follow up.  She reports that since changing her estrogen, she has not felt as well.  Some increased agitation.  Is taking estrogen, but feels needs higher dose ("to be more equivalent").  No sinus congestion or headache.  Does report an episode of chest pain that she had last week.  Noted chest pain that radiated through to back.  Lasted approximately 30 minutes.  Resolved.  States she has experienced this pain for years.  Rare episodes.  No chest pain or tightness with increased activity or exertion.  No sob.  No known acid reflux. No abdominal pain.  Bowels moving.  Discussed further w/up for chest pain.  She declines.  Also declines f/u CT scan.     Past Medical History:  Diagnosis Date  . Arthritis   . Cancer ([place])    basal cell  . Hyperlipidemia   . Nephrolithiasis   . Postmenopausal HRT (hormone replacement therapy)   . Pulmonary nodules   . Raynaud's phenomenon   . Renal stone    Past Surgical History:  Procedure Laterality Date  . anal fissure surgery  9/06  . LITHOTRIPSY    . TONSILLECTOMY     Family History  Problem Relation Age of Onset  . Breast cancer Mother        died age 21  . Heart disease Father        s/p CABG  . Aneurysm Father   . Breast cancer Other        maternal niece  . Colon cancer Neg Hx    Social History   Socioeconomic History  . Marital status: Married    Spouse name: Gershon Mussel  . Number of children: 2  . Years of education: Not on file  . Highest education level: Not on file  Occupational History    Employer: [employer]  Social Needs  . Financial resource strain: Not on file  . Food insecurity:    Worry: Not on file    Inability: Not on file  . Transportation needs:    Medical: Not on file    Non-medical:  Not on file  Tobacco Use  . Smoking status: Never Smoker  . Smokeless tobacco: Never Used  Substance and Sexual Activity  . Alcohol use: No    Alcohol/week: 0.0 standard drinks  . Drug use: No  . Sexual activity: Not Currently  Lifestyle  . Physical activity:    Days per week: Not on file    Minutes per session: Not on file  . Stress: Not on file  Relationships  . Social connections:    Talks on phone: Not on file    Gets together: Not on file    Attends religious service: Not on file    Active member of club or organization: Not on file    Attends meetings of clubs or organizations: Not on file    Relationship status: Not on file  Other Topics Concern  . Not on file  Social History Narrative  . Not on file    Outpatient Encounter Medications as of 12/23/2018  Medication Sig  . Cholecalciferol (VITAMIN D-3) 1000 UNITS CAPS Take 1 capsule by mouth daily.  . medroxyPROGESTERone (PROVERA) 2.5 MG  tablet Take 1 tablet (2.5 mg total) by mouth daily.  . polycarbophil (FIBERCON) 625 MG tablet Take 625 mg by mouth daily.  . simvastatin (ZOCOR) 20 MG tablet TAKE 1 TABLET BY MOUTH ONCE DAILY  . [DISCONTINUED] estradiol (ESTRACE) 0.5 MG tablet Take 1 tablet qod  . estradiol (ESTRACE) 1 MG tablet Take 1 tablet (1 mg total) by mouth every other day.   No facility-administered encounter medications on file as of 12/23/2018.     Review of Systems  Constitutional: Negative for appetite change and unexpected weight change.  HENT: Negative for congestion and sinus pressure.   Respiratory: Negative for cough, chest tightness and shortness of breath.   Cardiovascular: Positive for chest pain. Negative for palpitations and leg swelling.  Gastrointestinal: Negative for abdominal pain, diarrhea, nausea and vomiting.  Genitourinary: Negative for difficulty urinating and dysuria.  Musculoskeletal: Negative for joint swelling and myalgias.  Skin: Negative for color change and rash.  Neurological:  Negative for dizziness, light-headedness and headaches.  Psychiatric/Behavioral: Negative for agitation and dysphoric mood.       Objective:    Physical Exam Constitutional:      General: She is not in acute distress.    Appearance: Normal appearance.  HENT:     Nose: Nose normal. No congestion.     Mouth/Throat:     Pharynx: No oropharyngeal exudate or posterior oropharyngeal erythema.  Neck:     Musculoskeletal: Neck supple. No muscular tenderness.     Thyroid: No thyromegaly.  Cardiovascular:     Rate and Rhythm: Normal rate and regular rhythm.  Pulmonary:     Effort: No respiratory distress.     Breath sounds: Normal breath sounds. No wheezing.  Abdominal:     General: Bowel sounds are normal.     Palpations: Abdomen is soft.     Tenderness: There is no abdominal tenderness.  Musculoskeletal:        General: No swelling or tenderness.  Lymphadenopathy:     Cervical: No cervical adenopathy.  Skin:    Findings: No erythema or rash.  Neurological:     Mental Status: She is alert.  Psychiatric:        Mood and Affect: Mood normal.        Behavior: Behavior normal.     BP 122/78 (BP Location: Left Arm, Patient Position: Sitting, Cuff Size: Normal)   Pulse 77   Temp 97.9 F (36.6 C) (Oral)   Resp 16   Wt 159 lb 3.2 oz (72.2 kg)   SpO2 97%   BMI 26.49 kg/m  Wt Readings from Last 3 Encounters:  12/23/18 159 lb 3.2 oz (72.2 kg)  08/12/18 161 lb (73 kg)  02/07/18 157 lb 9.6 oz (71.5 kg)     Lab Results  Component Value Date   WBC 6.6 08/08/2018   HGB 12.9 08/08/2018   HCT 38.2 08/08/2018   PLT 242.0 08/08/2018   GLUCOSE 82 09/09/2018   CHOL 159 08/08/2018   TRIG 106.0 08/08/2018   HDL 49.60 08/08/2018   LDLCALC 88 08/08/2018   ALT 17 08/08/2018   AST 21 08/08/2018   NA 139 09/09/2018   K 4.4 09/09/2018   CL 106 09/09/2018   CREATININE 1.10 09/09/2018   BUN 16 09/09/2018   CO2 27 09/09/2018   TSH 3.93 08/08/2018    Mm 3d Screen Breast  Bilateral  Result Date: 09/30/2018 CLINICAL DATA:  Screening. EXAM: DIGITAL SCREENING BILATERAL MAMMOGRAM WITH TOMO AND CAD COMPARISON:  Previous exam(s). ACR  Breast Density Category c: The breast tissue is heterogeneously dense, which may obscure small masses. FINDINGS: There are no findings suspicious for malignancy. Images were processed with CAD. IMPRESSION: No mammographic evidence of malignancy. A result letter of this screening mammogram will be mailed directly to the patient. RECOMMENDATION: Screening mammogram in one year. (Code:SM-B-01Y) BI-RADS CATEGORY  1: Negative. Electronically Signed   By: Curlene Dolphin M.D.   On: 09/30/2018 15:23       Assessment & Plan:   Problem List Items Addressed This Visit    Abnormal chest CT    Previously worked up by oncology.  Last CT stable.  Discussed again with her regarding f/u CT chest.  She declines.        Chest pain    Symptoms as outlined.  Discussed further w/up and evaluation.  Discussed EKG, etc.  She declines.  Wants to monitor.        Current use of estrogen therapy    Discussed with her today.  She does not feel as good since the change in her estrogen.  Feels needs to increase the dose.  Will increase to 1mg  qod.  Follow.  Discussed risk and possible side effects of estrogen therapy.        Hypercholesterolemia    On simvastatin.  Low cholesterol diet and exercise.  Follow lipid panel and liver function tests.        Relevant Orders   Hepatic function panel   Lipid panel   Basic metabolic panel   Vitamin D deficiency    Follow vitamin D level.            Einar Pheasant, MD

## 2018-12-29 ENCOUNTER — Encounter: Payer: Self-pay | Admitting: Internal Medicine

## 2018-12-29 DIAGNOSIS — R079 Chest pain, unspecified: Secondary | ICD-10-CM | POA: Insufficient documentation

## 2018-12-29 NOTE — Assessment & Plan Note (Signed)
Symptoms as outlined.  Discussed further w/up and evaluation.  Discussed EKG, etc.  She declines.  Wants to monitor.

## 2018-12-29 NOTE — Assessment & Plan Note (Signed)
Previously worked up by oncology.  Last CT stable.  Discussed again with her regarding f/u CT chest.  She declines.

## 2018-12-29 NOTE — Assessment & Plan Note (Signed)
On simvastatin.  Low cholesterol diet and exercise.  Follow lipid panel and liver function tests.   

## 2018-12-29 NOTE — Assessment & Plan Note (Signed)
Follow vitamin D level.  

## 2018-12-29 NOTE — Assessment & Plan Note (Signed)
Discussed with her today.  She does not feel as good since the change in her estrogen.  Feels needs to increase the dose.  Will increase to 1mg  qod.  Follow.  Discussed risk and possible side effects of estrogen therapy.

## 2019-01-19 ENCOUNTER — Encounter: Payer: Self-pay | Admitting: Internal Medicine

## 2019-01-20 ENCOUNTER — Ambulatory Visit (INDEPENDENT_AMBULATORY_CARE_PROVIDER_SITE_OTHER): Payer: Medicare HMO | Admitting: Family Medicine

## 2019-01-20 ENCOUNTER — Ambulatory Visit: Payer: Medicare HMO | Admitting: Internal Medicine

## 2019-01-20 ENCOUNTER — Encounter: Payer: Self-pay | Admitting: Family Medicine

## 2019-01-20 VITALS — BP 140/78 | HR 71 | Temp 98.9°F | Resp 16 | Ht 65.0 in | Wt 157.4 lb

## 2019-01-20 DIAGNOSIS — R05 Cough: Secondary | ICD-10-CM | POA: Diagnosis not present

## 2019-01-20 DIAGNOSIS — J988 Other specified respiratory disorders: Secondary | ICD-10-CM | POA: Diagnosis not present

## 2019-01-20 DIAGNOSIS — R058 Other specified cough: Secondary | ICD-10-CM

## 2019-01-20 MED ORDER — BENZONATATE 100 MG PO CAPS: 100.0000 mg | ORAL_CAPSULE | Freq: Three times a day (TID) | ORAL | 0 refills | Status: DC | PRN

## 2019-01-20 MED ORDER — AZITHROMYCIN 250 MG PO TABS: ORAL_TABLET | ORAL | 0 refills | Status: DC

## 2019-01-20 NOTE — Telephone Encounter (Signed)
Pt has an appt today with Ander Purpura

## 2019-01-20 NOTE — Progress Notes (Signed)
Subjective:    Patient ID: Kristin Phelps, female    DOB: Jul 23, 1941, 78 y.o.   MRN: [id number]  HPI   Patient presents to clinic complaining of cough, chest congestion, wheezing at night and coughing up yellow phlegm for the past 4-5 nights.  Denies fever chills.  Denies body aches.  Denies chest pain.  Denies nausea/vomiting or diarrhea.  Patient has no history of asthma, COPD and she is a non-smoker.  She has been using over-the-counter Robitussin-DM and Mucinex with okay effect in helping to calm cough.  Patient became concerned because she began coughing up clear phlegm at first and now it is turned to thick and yellow.  Patient Active Problem List   Diagnosis Date Noted  . Chest pain 12/29/2018  . Current use of estrogen therapy 08/12/2018  . Poison ivy dermatitis 05/08/2016  . Health care maintenance 03/29/2015  . Decreased hearing 07/11/2014  . Abnormal chest CT 11/22/2013  . Hypercholesterolemia 12/30/2012  . Nephrolithiasis 12/30/2012  . Skin cancer 12/30/2012  . Vitamin D deficiency 12/30/2012   Social History   Tobacco Use  . Smoking status: Never Smoker  . Smokeless tobacco: Never Used  Substance Use Topics  . Alcohol use: No    Alcohol/week: 0.0 standard drinks   Review of Systems   Constitutional: Negative for chills, fatigue and fever.  HENT: Negative for congestion, ear pain, sinus pain and sore throat.   Eyes: Negative.   Respiratory: +cough, chest congestion, wheezing (sometimes at night).   Cardiovascular: Negative for chest pain, palpitations and leg swelling.  Gastrointestinal: Negative for abdominal pain, diarrhea, nausea and vomiting.  Genitourinary: Negative for dysuria, frequency and urgency.  Musculoskeletal: Negative for arthralgias and myalgias.  Skin: Negative for color change, pallor and rash.  Neurological: Negative for syncope, light-headedness and headaches.  Psychiatric/Behavioral: The patient is not nervous/anxious.         Objective:   Physical Exam Vitals signs and nursing note reviewed.  Constitutional:      General: She is not in acute distress.    Appearance: She is not toxic-appearing.  HENT:     Head: Normocephalic.     Right Ear: Tympanic membrane, ear canal and external ear normal.     Left Ear: Tympanic membrane, ear canal and external ear normal.     Nose: Nose normal.     Mouth/Throat:     Mouth: Mucous membranes are moist.     Pharynx: No oropharyngeal exudate or posterior oropharyngeal erythema.  Eyes:     General: No scleral icterus.    Extraocular Movements: Extraocular movements intact.     Conjunctiva/sclera: Conjunctivae normal.  Neck:     Musculoskeletal: Neck supple. No neck rigidity.  Cardiovascular:     Rate and Rhythm: Normal rate and regular rhythm.  Pulmonary:     Effort: Pulmonary effort is normal. No respiratory distress.     Breath sounds: Wheezing (faint expiratory wheezes) present. No rhonchi or rales.     Comments: Wet cough Lymphadenopathy:     Cervical: No cervical adenopathy.  Skin:    General: Skin is warm and dry.     Coloration: Skin is not jaundiced or pale.  Neurological:     Mental Status: She is alert and oriented to person, place, and time.  Psychiatric:        Mood and Affect: Mood normal.        Behavior: Behavior normal.    Vitals:   01/20/19 1521  BP: 140/78  Pulse: 71  Resp: 16  Temp: 98.9 F (37.2 C)  SpO2: 93%       Assessment & Plan:   Respiratory infection/cough productive of yellow sputum- patient will take Z-Pak due to discoloration of phlegm and harsh cough.  Offered chest x-ray in clinic today however patient declines.  She will trial Tessalon Perles to use as needed to calm cough.  Advised if [employer] are not helpful, she can resume using Robitussin as she had been prior.  Offered steroid taper, patient declines.  Offered prescription for albuterol inhaler however patient declines states she does not feel she needs  it.  Strict return precautions given, advised if her breathing or cough worsens in any way she needs to call office right away and return for reevaluation and/or go to urgent care/walk-in if office is closed.  Patient verbalized understanding.  She will keep regular follow-up with PCP as planned.  Advised to return to clinic sooner if issues arise.

## 2019-01-20 NOTE — Telephone Encounter (Signed)
With wheezing, coughing, etc, agree she does need to be seen.

## 2019-03-01 ENCOUNTER — Other Ambulatory Visit: Payer: Self-pay | Admitting: Internal Medicine

## 2019-04-19 ENCOUNTER — Other Ambulatory Visit: Payer: Self-pay | Admitting: Internal Medicine

## 2019-06-04 ENCOUNTER — Telehealth: Payer: Self-pay | Admitting: Internal Medicine

## 2019-06-04 NOTE — Telephone Encounter (Signed)
estradiol (ESTRACE) 1 MG tablet [[id number]]    Pt called and stated that she could not get this medication filled. Pt states that the RX needs to say that she can take this medication everyday. Pt would like a call back from the nurse.

## 2019-06-08 ENCOUNTER — Telehealth: Payer: Self-pay | Admitting: Internal Medicine

## 2019-06-08 ENCOUNTER — Other Ambulatory Visit: Payer: Self-pay

## 2019-06-08 MED ORDER — ESTRADIOL 1 MG PO TABS: 1.0000 mg | ORAL_TABLET | Freq: Every day | ORAL | 2 refills | Status: DC

## 2019-06-08 MED ORDER — MEDROXYPROGESTERONE ACETATE 2.5 MG PO TABS: 2.5000 mg | ORAL_TABLET | Freq: Every day | ORAL | 2 refills | Status: DC

## 2019-06-08 NOTE — Telephone Encounter (Signed)
Patient is calling regarding her medication. Please advice CB- [phone]- until 2p after 2;15 she will be at [phone]

## 2019-06-08 NOTE — Telephone Encounter (Signed)
Spoke with patient. Fixed prescriptions and sent to [facility]. 90 day supply sent in for both

## 2019-06-08 NOTE — Telephone Encounter (Signed)
Already taken care of. See other phone message

## 2019-06-08 NOTE — Telephone Encounter (Signed)
Called patient back at both numbers to see what questions she had about her medications.  No answer.  Unable to leave a voice message.

## 2019-07-13 ENCOUNTER — Other Ambulatory Visit: Payer: Self-pay | Admitting: Internal Medicine

## 2019-08-27 ENCOUNTER — Other Ambulatory Visit (INDEPENDENT_AMBULATORY_CARE_PROVIDER_SITE_OTHER): Payer: Medicare HMO

## 2019-08-27 ENCOUNTER — Encounter: Payer: Self-pay | Admitting: Internal Medicine

## 2019-08-27 ENCOUNTER — Other Ambulatory Visit: Payer: Self-pay

## 2019-08-27 DIAGNOSIS — E7800 Pure hypercholesterolemia, unspecified: Secondary | ICD-10-CM | POA: Diagnosis not present

## 2019-08-27 LAB — HEPATIC FUNCTION PANEL
ALT: 15 U/L (ref 0–35)
AST: 20 U/L (ref 0–37)
Albumin: 4.1 g/dL (ref 3.5–5.2)
Alkaline Phosphatase: 62 U/L (ref 39–117)
Bilirubin, Direct: 0 mg/dL (ref 0.0–0.3)
Total Bilirubin: 0.4 mg/dL (ref 0.2–1.2)
Total Protein: 6.6 g/dL (ref 6.0–8.3)

## 2019-08-27 LAB — LIPID PANEL
Cholesterol: 161 mg/dL (ref 0–200)
HDL: 45.5 mg/dL (ref >39.00)
LDL Cholesterol: 91 mg/dL (ref 0–99)
NonHDL: 115.07
Total CHOL/HDL Ratio: 4
Triglycerides: 120 mg/dL (ref 0.0–149.0)
VLDL: 24 mg/dL (ref 0.0–40.0)

## 2019-08-27 LAB — BASIC METABOLIC PANEL
BUN: 19 mg/dL (ref 6–23)
CO2: 30 mEq/L (ref 19–32)
Calcium: 9.4 mg/dL (ref 8.4–10.5)
Chloride: 105 mEq/L (ref 96–112)
Creatinine, Ser: 1.14 mg/dL (ref 0.40–1.20)
GFR: 45.85 mL/min — ABNORMAL LOW (ref >60.00)
Glucose, Bld: 91 mg/dL (ref 70–99)
Potassium: 3.9 mEq/L (ref 3.5–5.1)
Sodium: 141 mEq/L (ref 135–145)

## 2019-09-01 ENCOUNTER — Other Ambulatory Visit: Payer: Self-pay

## 2019-09-01 ENCOUNTER — Other Ambulatory Visit: Payer: Self-pay | Admitting: Internal Medicine

## 2019-09-01 ENCOUNTER — Ambulatory Visit (INDEPENDENT_AMBULATORY_CARE_PROVIDER_SITE_OTHER): Payer: Medicare HMO | Admitting: Internal Medicine

## 2019-09-01 ENCOUNTER — Encounter: Payer: Self-pay | Admitting: Internal Medicine

## 2019-09-01 VITALS — BP 122/70 | HR 81 | Temp 97.3°F | Resp 16 | Ht 64.0 in | Wt 159.0 lb

## 2019-09-01 DIAGNOSIS — Z1239 Encounter for other screening for malignant neoplasm of breast: Secondary | ICD-10-CM | POA: Diagnosis not present

## 2019-09-01 DIAGNOSIS — Z1231 Encounter for screening mammogram for malignant neoplasm of breast: Secondary | ICD-10-CM

## 2019-09-01 DIAGNOSIS — E7800 Pure hypercholesterolemia, unspecified: Secondary | ICD-10-CM

## 2019-09-01 DIAGNOSIS — R9389 Abnormal findings on diagnostic imaging of other specified body structures: Secondary | ICD-10-CM | POA: Diagnosis not present

## 2019-09-01 DIAGNOSIS — Z0000 Encounter for general adult medical examination without abnormal findings: Secondary | ICD-10-CM | POA: Diagnosis not present

## 2019-09-01 DIAGNOSIS — Z79899 Other long term (current) drug therapy: Secondary | ICD-10-CM

## 2019-09-01 DIAGNOSIS — E559 Vitamin D deficiency, unspecified: Secondary | ICD-10-CM | POA: Diagnosis not present

## 2019-09-01 DIAGNOSIS — N898 Other specified noninflammatory disorders of vagina: Secondary | ICD-10-CM

## 2019-09-01 NOTE — Progress Notes (Signed)
Patient ID: Kristin Phelps, female   DOB: November 12, 1941, 78 y.o.   MRN: [id number]   Subjective:    Patient ID: Kristin Phelps, female    DOB: February 06, 1941, 78 y.o.   MRN: [id number]  HPI  Patient here for her physical exam.  She reports she is doing relatively well.  Working.  Tries to stay active.  Feels breathing stable.  No chest congestion or increased cough.  No sob.  No chest pain.  No acid reflux.  No abdominal pain.  Bowels moving.  Reports persistent raised lesions - outer vagina.  Occasionally will have itching.  No vaginal bleeding.  Occasionally will notice a discharge.  Rarely occurs.  Taking estrogen and progesterone.  Discussed stopping.  She declines.  Discussed risk of continued hormone therapy.     Past Medical History:  Diagnosis Date  . Arthritis   . Cancer ([place])    basal cell  . Hyperlipidemia   . Nephrolithiasis   . Postmenopausal HRT (hormone replacement therapy)   . Pulmonary nodules   . Raynaud's phenomenon   . Renal stone    Past Surgical History:  Procedure Laterality Date  . anal fissure surgery  9/06  . LITHOTRIPSY    . TONSILLECTOMY     Family History  Problem Relation Age of Onset  . Breast cancer Mother        died age 50  . Heart disease Father        s/p CABG  . Aneurysm Father   . Breast cancer Other        maternal niece  . Colon cancer Neg Hx    Social History   Socioeconomic History  . Marital status: Married    Spouse name: Gershon Mussel  . Number of children: 2  . Years of education: Not on file  . Highest education level: Not on file  Occupational History    Employer: [employer]  Social Needs  . Financial resource strain: Not on file  . Food insecurity    Worry: Not on file    Inability: Not on file  . Transportation needs    Medical: Not on file    Non-medical: Not on file  Tobacco Use  . Smoking status: Never Smoker  . Smokeless tobacco: Never Used  Substance and Sexual Activity  . Alcohol use: No   Alcohol/week: 0.0 standard drinks  . Drug use: No  . Sexual activity: Not Currently  Lifestyle  . Physical activity    Days per week: Not on file    Minutes per session: Not on file  . Stress: Not on file  Relationships  . Social Herbalist on phone: Not on file    Gets together: Not on file    Attends religious service: Not on file    Active member of club or organization: Not on file    Attends meetings of clubs or organizations: Not on file    Relationship status: Not on file  Other Topics Concern  . Not on file  Social History Narrative  . Not on file    Outpatient Encounter Medications as of 09/01/2019  Medication Sig  . Cholecalciferol (VITAMIN D-3) 1000 UNITS CAPS Take 1 capsule by mouth daily.  Marland Kitchen estradiol (ESTRACE) 1 MG tablet Take 1 tablet (1 mg total) by mouth daily.  . medroxyPROGESTERone (PROVERA) 2.5 MG tablet Take 1 tablet (2.5 mg total) by mouth daily.  Marland Kitchen nystatin cream (MYCOSTATIN) Apply 1 application  topically 2 (two) times daily.  . polycarbophil (FIBERCON) 625 MG tablet Take 625 mg by mouth daily.  . simvastatin (ZOCOR) 20 MG tablet Take 1 tablet by mouth once daily  . [DISCONTINUED] azithromycin (ZITHROMAX) 250 MG tablet Take 2 tablets on day 1, take 1 tablet on days 2-5  . [DISCONTINUED] benzonatate (TESSALON) 100 MG capsule Take 1 capsule (100 mg total) by mouth 3 (three) times daily as needed for cough.   No facility-administered encounter medications on file as of 09/01/2019.     Review of Systems  Constitutional: Negative for appetite change and unexpected weight change.  HENT: Negative for congestion and sinus pressure.   Eyes: Negative for pain and visual disturbance.  Respiratory: Negative for cough, chest tightness and shortness of breath.   Cardiovascular: Negative for chest pain, palpitations and leg swelling.  Gastrointestinal: Negative for abdominal pain, diarrhea, nausea and vomiting.  Genitourinary: Negative for difficulty  urinating and dysuria.  Musculoskeletal: Negative for joint swelling and myalgias.  Skin: Negative for color change and rash.  Neurological: Negative for dizziness, light-headedness and headaches.  Hematological: Negative for adenopathy. Does not bruise/bleed easily.  Psychiatric/Behavioral: Negative for agitation and dysphoric mood.       Objective:    Physical Exam Constitutional:      General: She is not in acute distress.    Appearance: Normal appearance. She is well-developed.  HENT:     Right Ear: External ear normal.     Left Ear: External ear normal.  Eyes:     General: No scleral icterus.       Right eye: No discharge.        Left eye: No discharge.     Conjunctiva/sclera: Conjunctivae normal.  Neck:     Musculoskeletal: Neck supple. No muscular tenderness.     Thyroid: No thyromegaly.  Cardiovascular:     Rate and Rhythm: Normal rate and regular rhythm.  Pulmonary:     Effort: No tachypnea, accessory muscle usage or respiratory distress.     Breath sounds: Normal breath sounds. No decreased breath sounds or wheezing.  Chest:     Breasts:        Right: No inverted nipple, mass, nipple discharge or tenderness (no axillary adenopathy).        Left: No inverted nipple, mass, nipple discharge or tenderness (no axilarry adenopathy).  Abdominal:     General: Bowel sounds are normal.     Palpations: Abdomen is soft.     Tenderness: There is no abdominal tenderness.  Genitourinary:    Comments: Declines intravaginal exam.  Raised lesions - outer vagina.  Appears to be c/w question of wart.  Non tener.   Musculoskeletal:        General: No swelling or tenderness.  Lymphadenopathy:     Cervical: No cervical adenopathy.  Skin:    Findings: No erythema or rash.  Neurological:     Mental Status: She is alert and oriented to person, place, and time.  Psychiatric:        Mood and Affect: Mood normal.        Behavior: Behavior normal.     BP 122/70   Pulse 81   Temp  (!) 97.3 F (36.3 C)   Resp 16   Ht 5\' 4"  (1.626 m)   Wt 159 lb (72.1 kg)   SpO2 99%   BMI 27.29 kg/m  Wt Readings from Last 3 Encounters:  09/01/19 159 lb (72.1 kg)  01/20/19 157 lb 6.4 oz (  71.4 kg)  12/23/18 159 lb 3.2 oz (72.2 kg)     Lab Results  Component Value Date   WBC 6.6 08/08/2018   HGB 12.9 08/08/2018   HCT 38.2 08/08/2018   PLT 242.0 08/08/2018   GLUCOSE 91 08/27/2019   CHOL 161 08/27/2019   TRIG 120.0 08/27/2019   HDL 45.50 08/27/2019   LDLCALC 91 08/27/2019   ALT 15 08/27/2019   AST 20 08/27/2019   NA 141 08/27/2019   K 3.9 08/27/2019   CL 105 08/27/2019   CREATININE 1.14 08/27/2019   BUN 19 08/27/2019   CO2 30 08/27/2019   TSH 3.93 08/08/2018    Mm 3d Screen Breast Bilateral  Result Date: 09/30/2018 CLINICAL DATA:  Screening. EXAM: DIGITAL SCREENING BILATERAL MAMMOGRAM WITH TOMO AND CAD COMPARISON:  Previous exam(s). ACR Breast Density Category c: The breast tissue is heterogeneously dense, which may obscure small masses. FINDINGS: There are no findings suspicious for malignancy. Images were processed with CAD. IMPRESSION: No mammographic evidence of malignancy. A result letter of this screening mammogram will be mailed directly to the patient. RECOMMENDATION: Screening mammogram in one year. (Code:SM-B-01Y) BI-RADS CATEGORY  1: Negative. Electronically Signed   By: Curlene Dolphin M.D.   On: 09/30/2018 15:23       Assessment & Plan:   Problem List Items Addressed This Visit    Abnormal chest CT    Previously worked up by oncology.  Last CT stable. Discussed again with her regarding f/u CT and further w/up.  She declines.        Current use of estrogen therapy    Discussed with her today.  Discussed possible risk and side effects of continued estrogen therapy.  She declines to stop.  Follow.        Health care maintenance    Physical today 09/01/19.  Declines colonoscopy.  She will schedule her mammogram.  Last 09/30/18 - Birads I.         Hypercholesterolemia    On simvastatin.  Low cholesterol diet and exercise.  Follow lipid panel and liver function tests.        Vaginal lesion    Changes appear to be c/w question of wart.  Discuss the need for referral to gyn.  She declines.  Also discussed dong a pelvic exam today given some occasional discharge.  She declines.  Also declines gyn exam regarding discharge.  Wants to monitor.        Vitamin D deficiency    Follow vitamin D level.         Other Visit Diagnoses    Routine general medical examination at a health care facility    -  Primary   Breast cancer screening           Einar Pheasant, MD

## 2019-09-02 ENCOUNTER — Encounter: Payer: Self-pay | Admitting: Internal Medicine

## 2019-09-02 DIAGNOSIS — N898 Other specified noninflammatory disorders of vagina: Secondary | ICD-10-CM | POA: Insufficient documentation

## 2019-09-02 MED ORDER — NYSTATIN 100000 UNIT/GM EX CREA: 1.0000 "application " | TOPICAL_CREAM | Freq: Two times a day (BID) | CUTANEOUS | 0 refills | Status: DC

## 2019-09-02 NOTE — Assessment & Plan Note (Signed)
Physical today 09/01/19.  Declines colonoscopy.  She will schedule her mammogram.  Last 09/30/18 - Birads I.

## 2019-09-02 NOTE — Assessment & Plan Note (Signed)
On simvastatin.  Low cholesterol diet and exercise.  Follow lipid panel and liver function tests.   

## 2019-09-02 NOTE — Assessment & Plan Note (Signed)
Changes appear to be c/w question of wart.  Discuss the need for referral to gyn.  She declines.  Also discussed dong a pelvic exam today given some occasional discharge.  She declines.  Also declines gyn exam regarding discharge.  Wants to monitor.

## 2019-09-02 NOTE — Assessment & Plan Note (Signed)
Follow vitamin D level.  

## 2019-09-02 NOTE — Assessment & Plan Note (Signed)
Previously worked up by oncology.  Last CT stable. Discussed again with her regarding f/u CT and further w/up.  She declines.

## 2019-09-02 NOTE — Assessment & Plan Note (Signed)
Discussed with her today.  Discussed possible risk and side effects of continued estrogen therapy.  She declines to stop.  Follow.

## 2019-09-09 IMAGING — MG MM DIGITAL SCREENING BILAT W/ TOMO W/ CAD
6 of 10 series · 6 of 30 positions shown · non-contrast
Comparison: Previous exam(s).

CLINICAL DATA: Screening.

EXAM:
DIGITAL SCREENING BILATERAL MAMMOGRAM WITH TOMO AND CAD

[R MLO synth-2D]
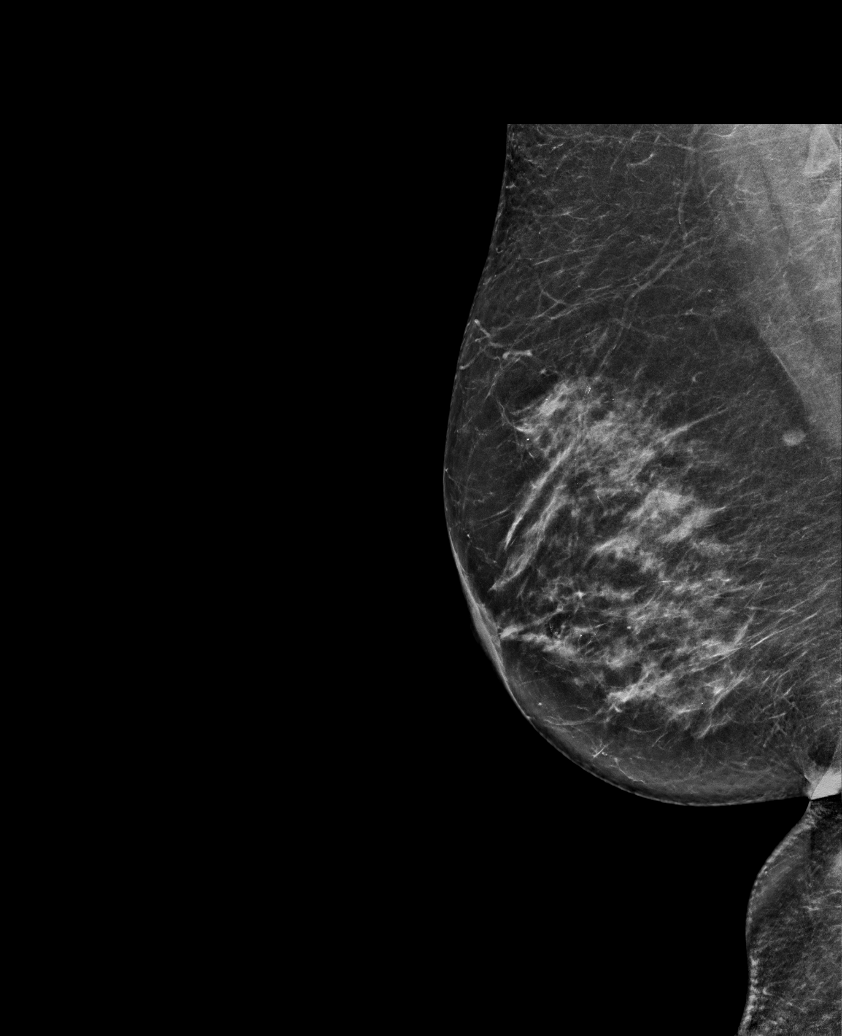

[L CC synth-2D (1 of 2)]
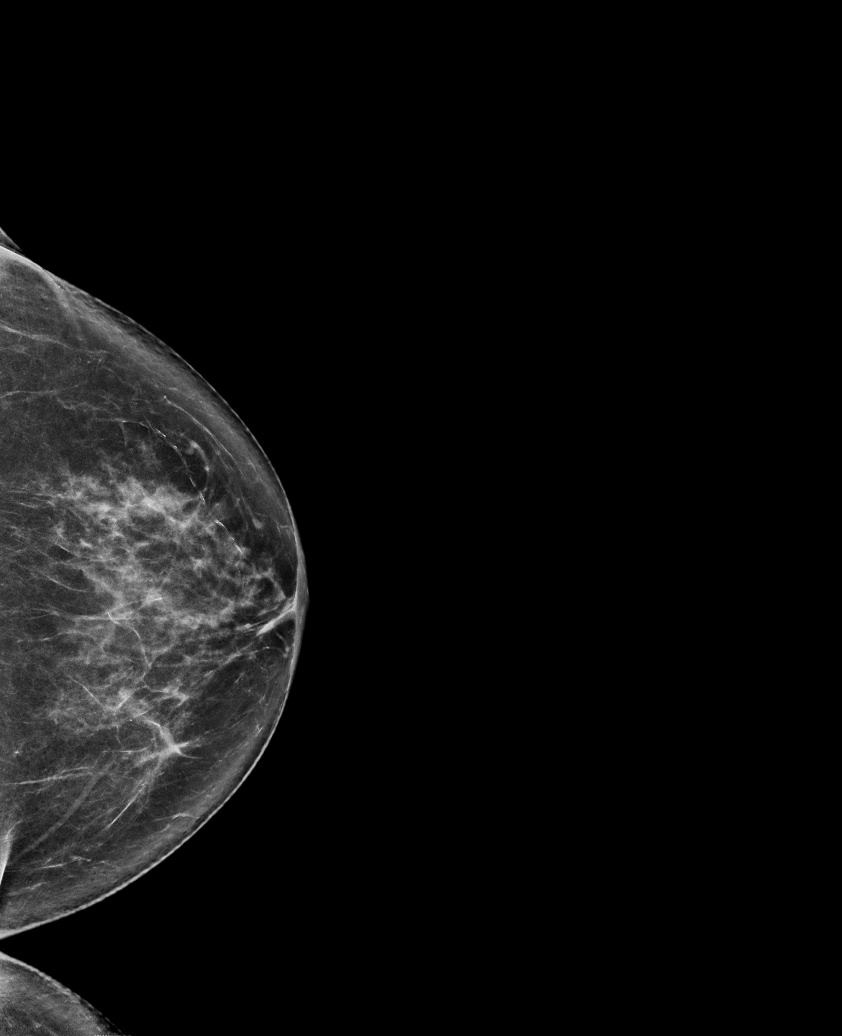

[R CC synth-2D]
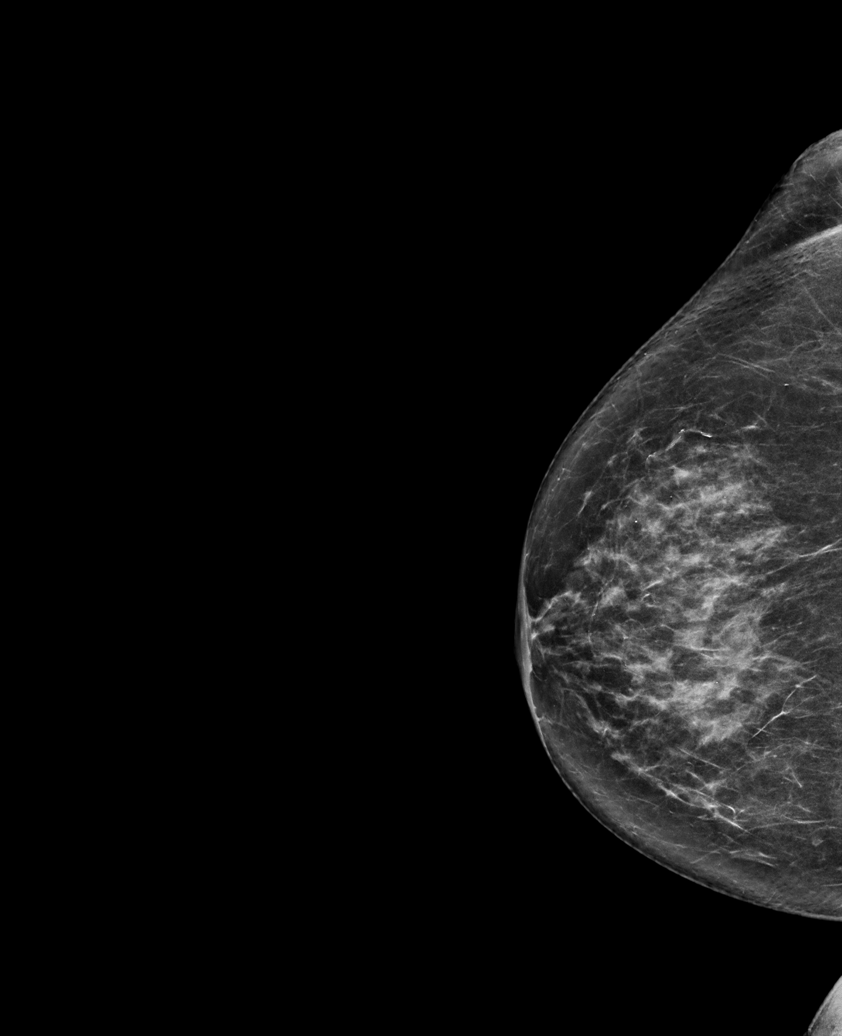

[L CC synth-2D (2 of 2)]
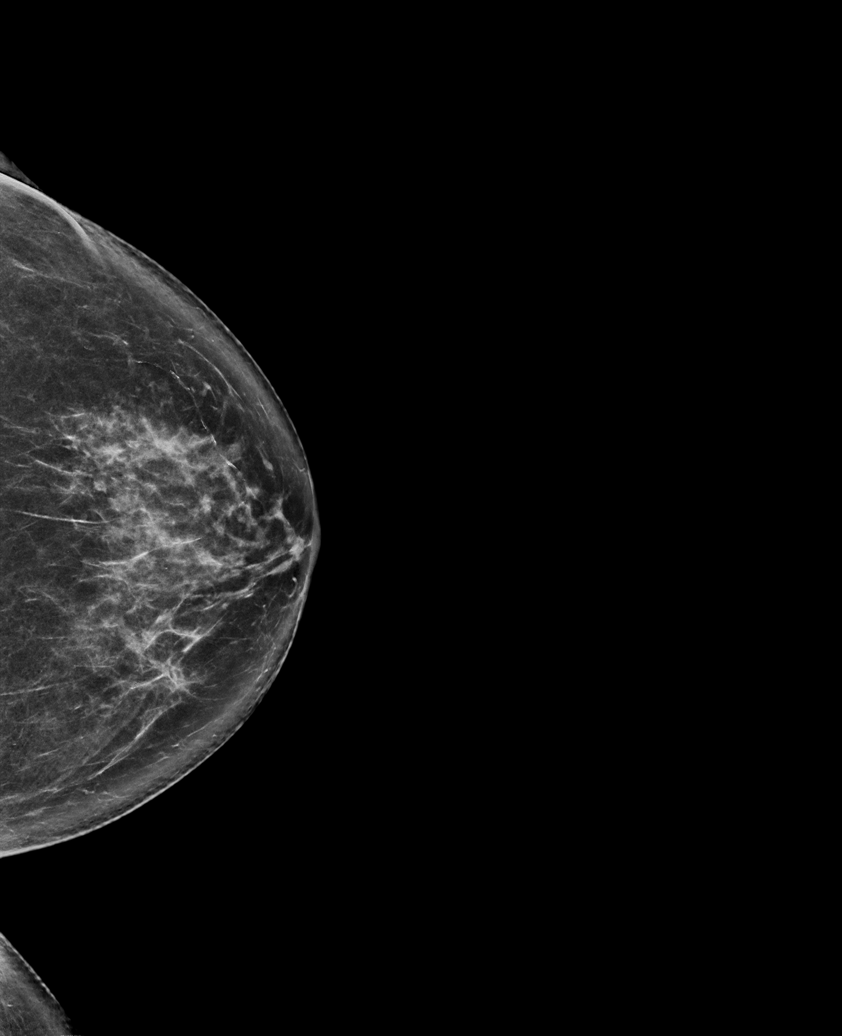

[L MLO synth-2D]
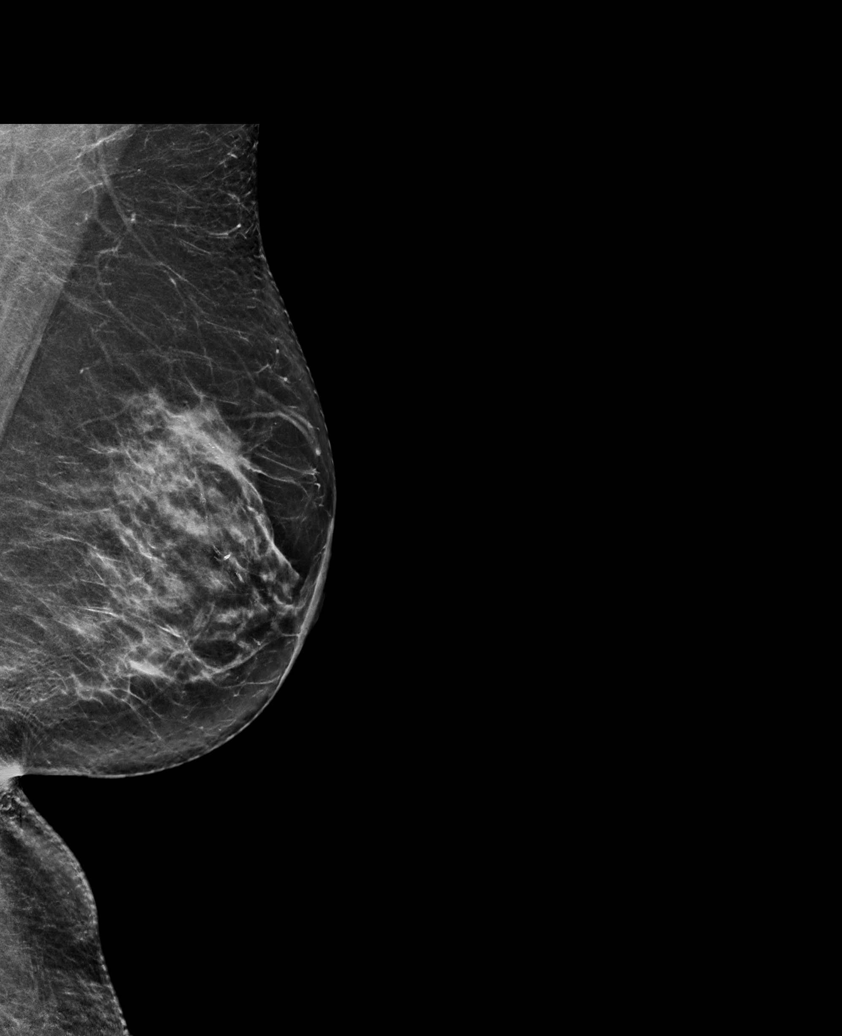

[L CC tomo · tomo slice 37/74.0]
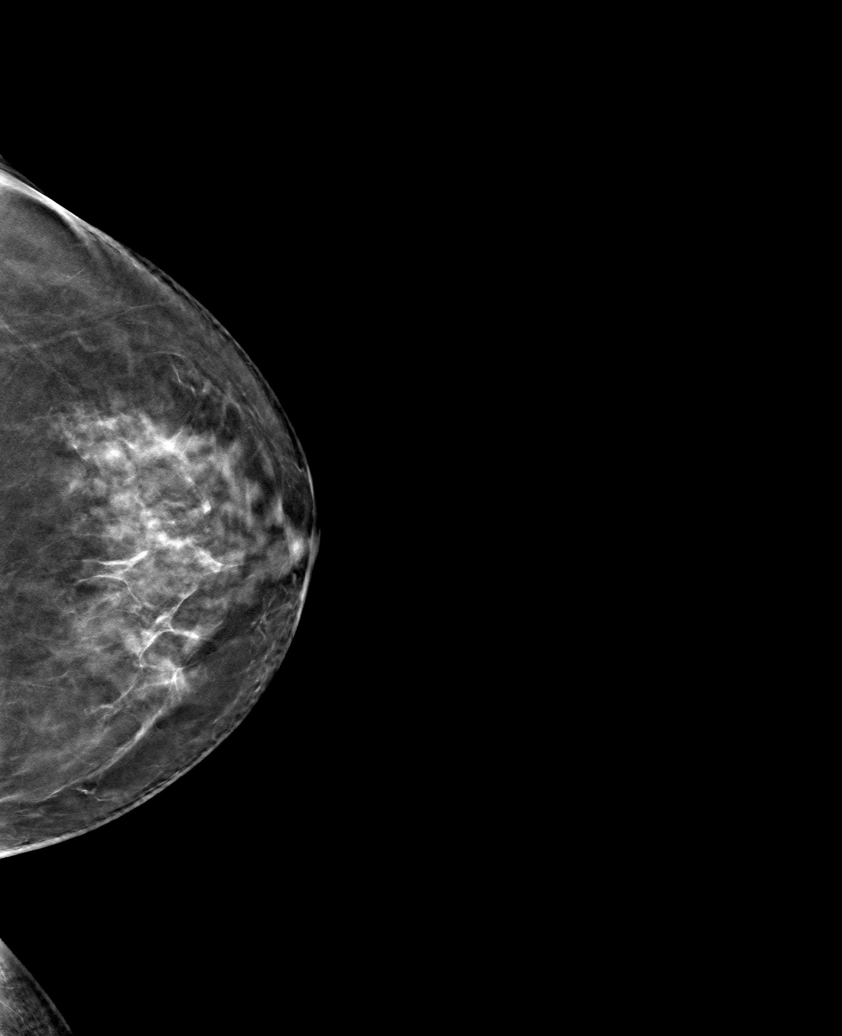

[6 of 30 positions shown; findings below may reference images not displayed]

ACR Breast Density Category c: The breast tissue is heterogeneously
dense, which may obscure small masses.
FINDINGS: There are no findings suspicious for malignancy. Images were
processed with CAD.
IMPRESSION: No mammographic evidence of malignancy. A result letter of this
screening mammogram will be mailed directly to the patient.

RECOMMENDATION:
Screening mammogram in one year. (Code:FT-U-LHB)

BI-RADS CATEGORY  1: Negative.

## 2019-10-04 ENCOUNTER — Other Ambulatory Visit: Payer: Self-pay | Admitting: Internal Medicine

## 2019-10-13 ENCOUNTER — Ambulatory Visit
Admission: RE | Admit: 2019-10-13 | Discharge: 2019-10-13 | Disposition: A | Discharge status: Home / Self Care | Payer: Medicare HMO | Source: Ambulatory Visit | Attending: Internal Medicine | Admitting: Internal Medicine

## 2019-10-13 DIAGNOSIS — Z1231 Encounter for screening mammogram for malignant neoplasm of breast: Secondary | ICD-10-CM | POA: Diagnosis not present

## 2020-01-10 ENCOUNTER — Other Ambulatory Visit: Payer: Self-pay | Admitting: Internal Medicine

## 2020-01-15 ENCOUNTER — Other Ambulatory Visit: Payer: Self-pay | Admitting: Internal Medicine

## 2020-02-22 ENCOUNTER — Encounter: Payer: Self-pay | Admitting: Internal Medicine

## 2020-03-01 ENCOUNTER — Other Ambulatory Visit: Payer: Self-pay

## 2020-03-01 ENCOUNTER — Ambulatory Visit (INDEPENDENT_AMBULATORY_CARE_PROVIDER_SITE_OTHER): Payer: Medicare HMO | Admitting: Internal Medicine

## 2020-03-01 VITALS — BP 118/62 | HR 69 | Temp 96.1°F | Resp 16 | Ht 64.0 in | Wt 152.8 lb

## 2020-03-01 DIAGNOSIS — R9389 Abnormal findings on diagnostic imaging of other specified body structures: Secondary | ICD-10-CM | POA: Diagnosis not present

## 2020-03-01 DIAGNOSIS — Z79899 Other long term (current) drug therapy: Secondary | ICD-10-CM

## 2020-03-01 DIAGNOSIS — R079 Chest pain, unspecified: Secondary | ICD-10-CM

## 2020-03-01 DIAGNOSIS — M25562 Pain in left knee: Secondary | ICD-10-CM | POA: Diagnosis not present

## 2020-03-01 DIAGNOSIS — E7800 Pure hypercholesterolemia, unspecified: Secondary | ICD-10-CM | POA: Diagnosis not present

## 2020-03-01 DIAGNOSIS — Z79818 Long term (current) use of other agents affecting estrogen receptors and estrogen levels: Secondary | ICD-10-CM

## 2020-03-01 DIAGNOSIS — E559 Vitamin D deficiency, unspecified: Secondary | ICD-10-CM | POA: Diagnosis not present

## 2020-03-01 LAB — BASIC METABOLIC PANEL
BUN: 17 mg/dL (ref 6–23)
CO2: 29 mEq/L (ref 19–32)
Calcium: 9.1 mg/dL (ref 8.4–10.5)
Chloride: 104 mEq/L (ref 96–112)
Creatinine, Ser: 0.97 mg/dL (ref 0.40–1.20)
GFR: 55.17 mL/min — ABNORMAL LOW (ref >60.00)
Glucose, Bld: 85 mg/dL (ref 70–99)
Potassium: 4.1 mEq/L (ref 3.5–5.1)
Sodium: 139 mEq/L (ref 135–145)

## 2020-03-01 LAB — CBC WITH DIFFERENTIAL/PLATELET
Basophils Absolute: 0.1 10*3/uL (ref 0.0–0.1)
Basophils Relative: 1 % (ref 0.0–3.0)
Eosinophils Absolute: 0.5 10*3/uL (ref 0.0–0.7)
Eosinophils Relative: 6.2 % — ABNORMAL HIGH (ref 0.0–5.0)
HCT: 39.7 % (ref 36.0–46.0)
Hemoglobin: 13.1 g/dL (ref 12.0–15.0)
Lymphocytes Relative: 15.3 % (ref 12.0–46.0)
Lymphs Abs: 1.2 10*3/uL (ref 0.7–4.0)
MCHC: 33 g/dL (ref 30.0–36.0)
MCV: 91 fl (ref 78.0–100.0)
Monocytes Absolute: 0.6 10*3/uL (ref 0.1–1.0)
Monocytes Relative: 7.5 % (ref 3.0–12.0)
Neutro Abs: 5.3 10*3/uL (ref 1.4–7.7)
Neutrophils Relative %: 70 % (ref 43.0–77.0)
Platelets: 264 10*3/uL (ref 150.0–400.0)
RBC: 4.37 Mil/uL (ref 3.87–5.11)
RDW: 12.9 % (ref 11.5–15.5)
WBC: 7.5 10*3/uL (ref 4.0–10.5)

## 2020-03-01 LAB — LIPID PANEL
Cholesterol: 157 mg/dL (ref 0–200)
HDL: 50.1 mg/dL (ref >39.00)
LDL Cholesterol: 90 mg/dL (ref 0–99)
NonHDL: 106.69
Total CHOL/HDL Ratio: 3
Triglycerides: 81 mg/dL (ref 0.0–149.0)
VLDL: 16.2 mg/dL (ref 0.0–40.0)

## 2020-03-01 LAB — VITAMIN D 25 HYDROXY (VIT D DEFICIENCY, FRACTURES): VITD: 48 ng/mL (ref 30.00–100.00)

## 2020-03-01 LAB — HEPATIC FUNCTION PANEL
ALT: 17 U/L (ref 0–35)
AST: 19 U/L (ref 0–37)
Albumin: 3.9 g/dL (ref 3.5–5.2)
Alkaline Phosphatase: 65 U/L (ref 39–117)
Bilirubin, Direct: 0.1 mg/dL (ref 0.0–0.3)
Total Bilirubin: 0.4 mg/dL (ref 0.2–1.2)
Total Protein: 6.4 g/dL (ref 6.0–8.3)

## 2020-03-01 LAB — TSH: TSH: 3.5 u[IU]/mL (ref 0.35–4.50)

## 2020-03-01 NOTE — Progress Notes (Signed)
Patient ID: Kristin Phelps, female   DOB: [birth year], 79 y.o.   MRN: [id number]   Subjective:    Patient ID: Kristin Phelps, female    DOB: November 19, 1941, 79 y.o.   MRN: [id number]  HPI This visit occurred during the SARS-CoV-2 public health emergency.  Safety protocols were in place, including screening questions prior to the visit, additional usage of staff PPE, and extensive cleaning of exam room while observing appropriate contact time as indicated for disinfecting solutions.  Patient here for a scheduled follow up.  She reports she is doing well.  Feels good.  Working two jobs currently.  Tries to stay active.  Occasionally will notice chest pain.  Has not occurred recently.  Same pain as she has previously described.  Desires no further intervention or testing.  No sob.  No cough or congestion.  No acid reflux reported.  No abdominal pain currently. Denies vaginal discharge.  Taking estrogen/progesterone.  Desires not to stop.  Had on two occasions - left leg pain.  Resolved quickly.  Not an issue for her now.  Left knee - pain with applying pressure on the knee.  Does not affect her walking.  No swelling.    Past Medical History:  Diagnosis Date  . Arthritis   . Cancer ([place])    basal cell  . Hyperlipidemia   . Nephrolithiasis   . Postmenopausal HRT (hormone replacement therapy)   . Pulmonary nodules   . Raynaud's phenomenon   . Renal stone    Past Surgical History:  Procedure Laterality Date  . anal fissure surgery  9/06  . LITHOTRIPSY    . TONSILLECTOMY     Family History  Problem Relation Age of Onset  . Breast cancer Mother        died age 16  . Heart disease Father        s/p CABG  . Aneurysm Father   . Breast cancer Other        maternal niece  . Colon cancer Neg Hx    Social History   Socioeconomic History  . Marital status: Married    Spouse name: Gershon Mussel  . Number of children: 2  . Years of education: Not on file  . Highest education level: Not on file    Occupational History    Employer: [employer]  Tobacco Use  . Smoking status: Never Smoker  . Smokeless tobacco: Never Used  Substance and Sexual Activity  . Alcohol use: No    Alcohol/week: 0.0 standard drinks  . Drug use: No  . Sexual activity: Not Currently  Other Topics Concern  . Not on file  Social History Narrative  . Not on file   Social Determinants of Health   Financial Resource Strain:   . Difficulty of Paying Living Expenses:   Food Insecurity:   . Worried About Charity fundraiser in the Last Year:   . Arboriculturist in the Last Year:   Transportation Needs:   . Film/video editor (Medical):   Marland Kitchen Lack of Transportation (Non-Medical):   Physical Activity:   . Days of Exercise per Week:   . Minutes of Exercise per Session:   Stress:   . Feeling of Stress :   Social Connections:   . Frequency of Communication with Friends and Family:   . Frequency of Social Gatherings with Friends and Family:   . Attends Religious Services:   . Active Member of Clubs or Organizations:   .  Attends Archivist Meetings:   Marland Kitchen Marital Status:     Outpatient Encounter Medications as of 03/01/2020  Medication Sig  . Cholecalciferol (VITAMIN D-3) 1000 UNITS CAPS Take 1 capsule by mouth daily.  Marland Kitchen estradiol (ESTRACE) 1 MG tablet Take 1 tablet (1 mg total) by mouth daily.  . medroxyPROGESTERone (PROVERA) 2.5 MG tablet Take 1 tablet (2.5 mg total) by mouth daily.  Marland Kitchen nystatin cream (MYCOSTATIN) Apply 1 application topically 2 (two) times daily.  . polycarbophil (FIBERCON) 625 MG tablet Take 625 mg by mouth daily.  . simvastatin (ZOCOR) 20 MG tablet Take 1 tablet by mouth once daily   No facility-administered encounter medications on file as of 03/01/2020.    Review of Systems  Constitutional: Negative for appetite change and unexpected weight change.  HENT: Negative for congestion and sinus pressure.   Respiratory: Negative for cough, chest tightness and  shortness of breath.   Cardiovascular: Positive for chest pain. Negative for palpitations and leg swelling.  Gastrointestinal: Negative for abdominal pain, diarrhea, nausea and vomiting.  Genitourinary: Negative for difficulty urinating and dysuria.  Musculoskeletal: Negative for joint swelling and myalgias.       Left leg pain as outlined.    Skin: Negative for color change and rash.  Neurological: Negative for dizziness, light-headedness and headaches.  Psychiatric/Behavioral: Negative for agitation and dysphoric mood.       Objective:    Physical Exam Constitutional:      General: She is not in acute distress.    Appearance: Normal appearance.  HENT:     Head: Normocephalic and atraumatic.     Right Ear: External ear normal.     Left Ear: External ear normal.  Eyes:     General: No scleral icterus.       Right eye: No discharge.        Left eye: No discharge.     Conjunctiva/sclera: Conjunctivae normal.  Neck:     Thyroid: No thyromegaly.  Cardiovascular:     Rate and Rhythm: Normal rate and regular rhythm.  Pulmonary:     Effort: No respiratory distress.     Breath sounds: Normal breath sounds. No wheezing.  Abdominal:     General: Bowel sounds are normal.     Palpations: Abdomen is soft.     Tenderness: There is no abdominal tenderness.  Musculoskeletal:        General: No swelling or tenderness.     Cervical back: Neck supple. No tenderness.  Lymphadenopathy:     Cervical: No cervical adenopathy.  Skin:    Findings: No erythema or rash.  Neurological:     Mental Status: She is alert.  Psychiatric:        Mood and Affect: Mood normal.        Behavior: Behavior normal.     BP 118/62   Pulse 69   Temp (!) 96.1 F (35.6 C)   Resp 16   Ht 5\' 4"  (1.626 m)   Wt 152 lb 12.8 oz (69.3 kg)   SpO2 99%   BMI 26.23 kg/m  Wt Readings from Last 3 Encounters:  03/01/20 152 lb 12.8 oz (69.3 kg)  09/01/19 159 lb (72.1 kg)  01/20/19 157 lb 6.4 oz (71.4 kg)      Lab Results  Component Value Date   WBC 7.5 03/01/2020   HGB 13.1 03/01/2020   HCT 39.7 03/01/2020   PLT 264.0 03/01/2020   GLUCOSE 85 03/01/2020   CHOL 157 03/01/2020  TRIG 81.0 03/01/2020   HDL 50.10 03/01/2020   LDLCALC 90 03/01/2020   ALT 17 03/01/2020   AST 19 03/01/2020   NA 139 03/01/2020   K 4.1 03/01/2020   CL 104 03/01/2020   CREATININE 0.97 03/01/2020   BUN 17 03/01/2020   CO2 29 03/01/2020   TSH 3.50 03/01/2020    MM 3D SCREEN BREAST BILATERAL  Result Date: 10/14/2019 CLINICAL DATA:  Screening. EXAM: DIGITAL SCREENING BILATERAL MAMMOGRAM WITH TOMO AND CAD COMPARISON:  Previous exam(s). ACR Breast Density Category c: The breast tissue is heterogeneously dense, which may obscure small masses. FINDINGS: There are no findings suspicious for malignancy. Images were processed with CAD. IMPRESSION: No mammographic evidence of malignancy. A result letter of this screening mammogram will be mailed directly to the patient. RECOMMENDATION: Screening mammogram in one year. (Code:SM-B-01Y) BI-RADS CATEGORY  1: Negative. Electronically Signed   By: Audie Pinto M.D.   On: 10/14/2019 11:03       Assessment & Plan:   Problem List Items Addressed This Visit    Abnormal chest CT    Previously worked up by oncology.  Last CT stable.  Discussed again with her regarding f/u CT and f/u with pulmonary, etc.  She declines.  Follow.       Chest pain    Describes intermittent chest pain.  Same intermittent pain as previously described.  Discussed further w/up.  She declines.        Current use of estrogen therapy    Discussed with her again today.  She desires to continue.  Understands possible risks and side effects.        Hypercholesterolemia - Primary    On simvastatin.  Low cholesterol diet and exercise.  Follow lipid panel and liver function tests.        Relevant Orders   CBC with Differential/Platelet (Completed)   Hepatic function panel (Completed)   Lipid  panel (Completed)   TSH (Completed)   Basic metabolic panel (Completed)   Hepatic function panel   Lipid panel   Basic metabolic panel   Left knee pain    Left knee pain as outlined.  Does not affect her walking.  Desires no further intervention.  Follow.        Vitamin D deficiency    Follow vitamin d level.       Relevant Orders   VITAMIN D 25 Hydroxy (Vit-D Deficiency, Fractures) (Completed)       Einar Pheasant, MD

## 2020-03-02 ENCOUNTER — Encounter: Payer: Self-pay | Admitting: Internal Medicine

## 2020-03-12 ENCOUNTER — Encounter: Payer: Self-pay | Admitting: Internal Medicine

## 2020-03-12 DIAGNOSIS — M25562 Pain in left knee: Secondary | ICD-10-CM | POA: Insufficient documentation

## 2020-03-12 NOTE — Assessment & Plan Note (Signed)
Left knee pain as outlined.  Does not affect her walking.  Desires no further intervention.  Follow.

## 2020-03-12 NOTE — Assessment & Plan Note (Signed)
Follow vitamin d level.   

## 2020-03-12 NOTE — Assessment & Plan Note (Signed)
Discussed with her again today.  She desires to continue.  Understands possible risks and side effects.

## 2020-03-12 NOTE — Assessment & Plan Note (Signed)
Previously worked up by oncology.  Last CT stable.  Discussed again with her regarding f/u CT and f/u with pulmonary, etc.  She declines.  Follow.

## 2020-03-12 NOTE — Assessment & Plan Note (Signed)
Describes intermittent chest pain.  Same intermittent pain as previously described.  Discussed further w/up.  She declines.

## 2020-03-12 NOTE — Assessment & Plan Note (Signed)
On simvastatin.  Low cholesterol diet and exercise.  Follow lipid panel and liver function tests.   

## 2020-04-07 ENCOUNTER — Other Ambulatory Visit: Payer: Self-pay | Admitting: Internal Medicine

## 2020-06-28 DIAGNOSIS — H524 Presbyopia: Secondary | ICD-10-CM | POA: Diagnosis not present

## 2020-07-10 ENCOUNTER — Other Ambulatory Visit: Payer: Self-pay | Admitting: Internal Medicine

## 2020-08-23 ENCOUNTER — Other Ambulatory Visit: Payer: Self-pay | Admitting: Internal Medicine

## 2020-08-30 ENCOUNTER — Other Ambulatory Visit (INDEPENDENT_AMBULATORY_CARE_PROVIDER_SITE_OTHER): Payer: Medicare HMO

## 2020-08-30 ENCOUNTER — Other Ambulatory Visit: Payer: Self-pay

## 2020-08-30 DIAGNOSIS — E7800 Pure hypercholesterolemia, unspecified: Secondary | ICD-10-CM

## 2020-08-30 LAB — BASIC METABOLIC PANEL
BUN: 16 mg/dL (ref 6–23)
CO2: 27 mEq/L (ref 19–32)
Calcium: 8.8 mg/dL (ref 8.4–10.5)
Chloride: 107 mEq/L (ref 96–112)
Creatinine, Ser: 1.05 mg/dL (ref 0.40–1.20)
GFR: 50.28 mL/min — ABNORMAL LOW (ref >60.00)
Glucose, Bld: 87 mg/dL (ref 70–99)
Potassium: 4.3 mEq/L (ref 3.5–5.1)
Sodium: 140 mEq/L (ref 135–145)

## 2020-08-30 LAB — LIPID PANEL
Cholesterol: 148 mg/dL (ref 0–200)
HDL: 44.7 mg/dL (ref >39.00)
LDL Cholesterol: 81 mg/dL (ref 0–99)
NonHDL: 103.01
Total CHOL/HDL Ratio: 3
Triglycerides: 111 mg/dL (ref 0.0–149.0)
VLDL: 22.2 mg/dL (ref 0.0–40.0)

## 2020-08-30 LAB — HEPATIC FUNCTION PANEL
ALT: 14 U/L (ref 0–35)
AST: 18 U/L (ref 0–37)
Albumin: 3.8 g/dL (ref 3.5–5.2)
Alkaline Phosphatase: 53 U/L (ref 39–117)
Bilirubin, Direct: 0.1 mg/dL (ref 0.0–0.3)
Total Bilirubin: 0.6 mg/dL (ref 0.2–1.2)
Total Protein: 5.9 g/dL — ABNORMAL LOW (ref 6.0–8.3)

## 2020-09-06 ENCOUNTER — Encounter: Payer: Medicare HMO | Admitting: Internal Medicine

## 2020-10-04 ENCOUNTER — Encounter: Payer: Self-pay | Admitting: Internal Medicine

## 2020-10-04 ENCOUNTER — Other Ambulatory Visit: Payer: Self-pay

## 2020-10-04 ENCOUNTER — Ambulatory Visit (INDEPENDENT_AMBULATORY_CARE_PROVIDER_SITE_OTHER): Payer: Medicare HMO | Admitting: Internal Medicine

## 2020-10-04 VITALS — BP 116/60 | HR 72 | Temp 98.5°F | Resp 16 | Ht 64.0 in | Wt 161.2 lb

## 2020-10-04 DIAGNOSIS — Z1231 Encounter for screening mammogram for malignant neoplasm of breast: Secondary | ICD-10-CM

## 2020-10-04 DIAGNOSIS — E7800 Pure hypercholesterolemia, unspecified: Secondary | ICD-10-CM

## 2020-10-04 DIAGNOSIS — Z0000 Encounter for general adult medical examination without abnormal findings: Secondary | ICD-10-CM | POA: Diagnosis not present

## 2020-10-04 DIAGNOSIS — Z79899 Other long term (current) drug therapy: Secondary | ICD-10-CM

## 2020-10-04 DIAGNOSIS — E559 Vitamin D deficiency, unspecified: Secondary | ICD-10-CM

## 2020-10-04 DIAGNOSIS — R9389 Abnormal findings on diagnostic imaging of other specified body structures: Secondary | ICD-10-CM | POA: Diagnosis not present

## 2020-10-04 MED ORDER — ESTRADIOL 1 MG PO TABS: 1.0000 mg | ORAL_TABLET | Freq: Every day | ORAL | 1 refills | Status: DC

## 2020-10-04 MED ORDER — SIMVASTATIN 20 MG PO TABS: 20.0000 mg | ORAL_TABLET | Freq: Every day | ORAL | 1 refills | Status: DC

## 2020-10-04 MED ORDER — NYSTATIN 100000 UNIT/GM EX CREA: 1.0000 "application " | TOPICAL_CREAM | Freq: Two times a day (BID) | CUTANEOUS | 0 refills | Status: DC

## 2020-10-04 NOTE — Assessment & Plan Note (Addendum)
Physical today 10/24/20.  Declines colonoscopy.  Discussed with her today.  Mammogram 10/14/19 - birads I.  Schedule f/u mammogram.

## 2020-10-04 NOTE — Progress Notes (Signed)
Patient ID: Kristin Phelps, female   DOB: June 12, 1941, 79 y.o.   MRN: [id number]   Subjective:    Patient ID: Kristin Phelps, female    DOB: [birth year], 79 y.o.   MRN: [id number]  HPI This visit occurred during the SARS-CoV-2 public health emergency.  Safety protocols were in place, including screening questions prior to the visit, additional usage of staff PPE, and extensive cleaning of exam room while observing appropriate contact time as indicated for disinfecting solutions.  Patient here for her physical exam.  She reports she is doing relatively well.  Stays active.  No chest pain reported. Feels breathing is stable.  No acid reflux or abdominal pain reported.  Bowels moving.  Previously noticed - blind spot followed by headache.  Present for multiple years now.  Discussed further w/up and evaluation.  She declines.  Handling stress. Overall she feels she is doing well.   Past Medical History:  Diagnosis Date  . Arthritis   . Cancer ([place])    basal cell  . Hyperlipidemia   . Nephrolithiasis   . Postmenopausal HRT (hormone replacement therapy)   . Pulmonary nodules   . Raynaud's phenomenon   . Renal stone    Past Surgical History:  Procedure Laterality Date  . anal fissure surgery  9/06  . LITHOTRIPSY    . TONSILLECTOMY     Family History  Problem Relation Age of Onset  . Breast cancer Mother        died age 33  . Heart disease Father        s/p CABG  . Aneurysm Father   . Breast cancer Other        maternal niece  . Colon cancer Neg Hx    Social History   Socioeconomic History  . Marital status: Married    Spouse name: Kristin Phelps  . Number of children: 2  . Years of education: Not on file  . Highest education level: Not on file  Occupational History    Employer: [employer]  Tobacco Use  . Smoking status: Never Smoker  . Smokeless tobacco: Never Used  Substance and Sexual Activity  . Alcohol use: No    Alcohol/week: 0.0 standard drinks  . Drug use:  No  . Sexual activity: Not Currently  Other Topics Concern  . Not on file  Social History Narrative  . Not on file   Social Determinants of Health   Financial Resource Strain:   . Difficulty of Paying Living Expenses: Not on file  Food Insecurity:   . Worried About Charity fundraiser in the Last Year: Not on file  . Ran Out of Food in the Last Year: Not on file  Transportation Needs:   . Lack of Transportation (Medical): Not on file  . Lack of Transportation (Non-Medical): Not on file  Physical Activity:   . Days of Exercise per Week: Not on file  . Minutes of Exercise per Session: Not on file  Stress:   . Feeling of Stress : Not on file  Social Connections:   . Frequency of Communication with Friends and Family: Not on file  . Frequency of Social Gatherings with Friends and Family: Not on file  . Attends Religious Services: Not on file  . Active Member of Clubs or Organizations: Not on file  . Attends Archivist Meetings: Not on file  . Marital Status: Not on file     Outpatient Encounter Medications as of 10/04/2020  Medication Sig  . Cholecalciferol (VITAMIN D-3) 1000 UNITS CAPS Take 1 capsule by mouth daily.  Marland Kitchen estradiol (ESTRACE) 1 MG tablet Take 1 tablet (1 mg total) by mouth daily.  . medroxyPROGESTERone (PROVERA) 2.5 MG tablet Take 1 tablet by mouth once daily  . nystatin cream (MYCOSTATIN) Apply 1 application topically 2 (two) times daily.  . polycarbophil (FIBERCON) 625 MG tablet Take 625 mg by mouth daily.  . simvastatin (ZOCOR) 20 MG tablet Take 1 tablet (20 mg total) by mouth daily.  . [DISCONTINUED] estradiol (ESTRACE) 1 MG tablet Take 1 tablet (1 mg total) by mouth daily.  . [DISCONTINUED] nystatin cream (MYCOSTATIN) Apply 1 application topically 2 (two) times daily.  . [DISCONTINUED] simvastatin (ZOCOR) 20 MG tablet Take 1 tablet by mouth once daily   No facility-administered encounter medications on file as of 10/04/2020.    Review of Systems    Constitutional: Negative for appetite change and unexpected weight change.  HENT: Negative for congestion, sinus pressure and sore throat.   Eyes: Positive for visual disturbance. Negative for pain.  Respiratory: Negative for cough and chest tightness.        Breathing stable.  No increased sob.   Cardiovascular: Negative for chest pain, palpitations and leg swelling.  Gastrointestinal: Negative for abdominal pain, diarrhea, nausea and vomiting.  Genitourinary: Negative for difficulty urinating and dysuria.  Musculoskeletal: Negative for joint swelling and myalgias.  Skin: Negative for color change and rash.  Neurological: Negative for dizziness, light-headedness and headaches.  Hematological: Negative for adenopathy. Does not bruise/bleed easily.  Psychiatric/Behavioral: Negative for agitation and dysphoric mood.       Objective:    Physical Exam Vitals reviewed.  Constitutional:      General: She is not in acute distress.    Appearance: Normal appearance. She is well-developed.  HENT:     Head: Normocephalic and atraumatic.     Right Ear: External ear normal.     Left Ear: External ear normal.  Eyes:     General: No scleral icterus.       Right eye: No discharge.        Left eye: No discharge.     Conjunctiva/sclera: Conjunctivae normal.  Neck:     Thyroid: No thyromegaly.  Cardiovascular:     Rate and Rhythm: Normal rate and regular rhythm.  Pulmonary:     Effort: No tachypnea, accessory muscle usage or respiratory distress.     Breath sounds: Normal breath sounds. No decreased breath sounds or wheezing.  Chest:     Breasts:        Right: No inverted nipple, mass, nipple discharge or tenderness (no axillary adenopathy).        Left: No inverted nipple, mass, nipple discharge or tenderness (no axilarry adenopathy).  Abdominal:     General: Bowel sounds are normal.     Palpations: Abdomen is soft.     Tenderness: There is no abdominal tenderness.  Musculoskeletal:         General: No swelling or tenderness.     Cervical back: Neck supple. No tenderness.  Lymphadenopathy:     Cervical: No cervical adenopathy.  Skin:    Findings: No erythema or rash.  Neurological:     Mental Status: She is alert and oriented to person, place, and time.  Psychiatric:        Mood and Affect: Mood normal.        Behavior: Behavior normal.     BP 116/60   Pulse  72   Temp 98.5 F (36.9 C) (Oral)   Resp 16   Ht 5\' 4"  (1.626 m)   Wt 161 lb 3.2 oz (73.1 kg)   SpO2 99%   BMI 27.67 kg/m  Wt Readings from Last 3 Encounters:  10/04/20 161 lb 3.2 oz (73.1 kg)  03/01/20 152 lb 12.8 oz (69.3 kg)  09/01/19 159 lb (72.1 kg)     Lab Results  Component Value Date   WBC 7.5 03/01/2020   HGB 13.1 03/01/2020   HCT 39.7 03/01/2020   PLT 264.0 03/01/2020   GLUCOSE 87 08/30/2020   CHOL 148 08/30/2020   TRIG 111.0 08/30/2020   HDL 44.70 08/30/2020   LDLCALC 81 08/30/2020   ALT 14 08/30/2020   AST 18 08/30/2020   NA 140 08/30/2020   K 4.3 08/30/2020   CL 107 08/30/2020   CREATININE 1.05 08/30/2020   BUN 16 08/30/2020   CO2 27 08/30/2020   TSH 3.50 03/01/2020    MM 3D SCREEN BREAST BILATERAL  Result Date: 10/14/2019 CLINICAL DATA:  Screening. EXAM: DIGITAL SCREENING BILATERAL MAMMOGRAM WITH TOMO AND CAD COMPARISON:  Previous exam(s). ACR Breast Density Category c: The breast tissue is heterogeneously dense, which may obscure small masses. FINDINGS: There are no findings suspicious for malignancy. Images were processed with CAD. IMPRESSION: No mammographic evidence of malignancy. A result letter of this screening mammogram will be mailed directly to the patient. RECOMMENDATION: Screening mammogram in one year. (Code:SM-B-01Y) BI-RADS CATEGORY  1: Negative. Electronically Signed   By: Audie Pinto M.D.   On: 10/14/2019 11:03       Assessment & Plan:   Problem List Items Addressed This Visit    Vitamin D deficiency   Hypercholesterolemia    On simvastatin.   Low cholesterol diet and exercise.  Follow lipid panel and liver function tests.        Relevant Medications   simvastatin (ZOCOR) 20 MG tablet   Other Relevant Orders   CBC with Differential/Platelet   Hepatic function panel   Lipid panel   Basic metabolic panel   Health care maintenance    Physical today 10/24/20.  Declines colonoscopy.  Discussed with her today.  Mammogram 10/14/19 - birads I.  Schedule f/u mammogram.        Current use of estrogen therapy    Discussed with her today. She wants to continue.  Follow.       Abnormal chest CT    Previously worked up by oncology.  Last CT stable.  Have discussed f/u CT and further pulmonary w/up with her.  Discussed again today. She declines.         Other Visit Diagnoses    Encounter for screening mammogram for malignant neoplasm of breast    -  Primary   Relevant Orders   MM 3D SCREEN BREAST BILATERAL       Einar Pheasant, MD

## 2020-10-09 ENCOUNTER — Encounter: Payer: Self-pay | Admitting: Internal Medicine

## 2020-10-09 NOTE — Assessment & Plan Note (Signed)
Previously worked up by oncology.  Last CT stable.  Have discussed f/u CT and further pulmonary w/up with her.  Discussed again today. She declines.

## 2020-10-09 NOTE — Assessment & Plan Note (Signed)
Discussed with her today. She wants to continue.  Follow.

## 2020-10-09 NOTE — Assessment & Plan Note (Signed)
On simvastatin.  Low cholesterol diet and exercise.  Follow lipid panel and liver function tests.   

## 2020-11-15 ENCOUNTER — Ambulatory Visit
Admission: RE | Admit: 2020-11-15 | Discharge: 2020-11-15 | Disposition: A | Discharge status: Home / Self Care | Payer: Medicare HMO | Source: Ambulatory Visit | Attending: Internal Medicine | Admitting: Internal Medicine

## 2020-11-15 ENCOUNTER — Other Ambulatory Visit: Payer: Self-pay

## 2020-11-15 DIAGNOSIS — Z1231 Encounter for screening mammogram for malignant neoplasm of breast: Secondary | ICD-10-CM | POA: Diagnosis not present

## 2020-12-26 ENCOUNTER — Telehealth: Payer: Self-pay | Admitting: Internal Medicine

## 2020-12-26 NOTE — Telephone Encounter (Signed)
Pt called and said that she is having right side pain that feels like it did 40 years ago when she had kidney stone. She is wanting to have a x-ray  This has been going on since last Sunday 12/18/20

## 2020-12-26 NOTE — Telephone Encounter (Signed)
If concerned about appendix, I agree with need for evaluation asap.  We can f/u.

## 2020-12-26 NOTE — Telephone Encounter (Signed)
Spoken to patient she stated she is feeling right sided pain, she bent over to pick something up today and she had a very painful stabbing sensation. She stated her sx have been going on for ten days. Right sided dull ache with stabbing sensations at times. She had similar sx 40+ years ago and it was a kidney stone, she is worried about her appendix. NO blood in urine or urgency, fever, chills, nauseated, vomitting, diarrhea, and cramps. Patient has been taking  Instructed her to go to UC/ED today. Patient refused and stated she will go in the morning.

## 2021-02-03 ENCOUNTER — Other Ambulatory Visit: Payer: Medicare HMO

## 2021-02-06 ENCOUNTER — Other Ambulatory Visit (INDEPENDENT_AMBULATORY_CARE_PROVIDER_SITE_OTHER): Payer: Medicare HMO

## 2021-02-06 ENCOUNTER — Other Ambulatory Visit: Payer: Self-pay

## 2021-02-06 DIAGNOSIS — E7800 Pure hypercholesterolemia, unspecified: Secondary | ICD-10-CM

## 2021-02-06 LAB — CBC WITH DIFFERENTIAL/PLATELET
Basophils Absolute: 0.1 10*3/uL (ref 0.0–0.1)
Basophils Relative: 0.9 % (ref 0.0–3.0)
Eosinophils Absolute: 0.5 10*3/uL (ref 0.0–0.7)
Eosinophils Relative: 7.2 % — ABNORMAL HIGH (ref 0.0–5.0)
HCT: 40.4 % (ref 36.0–46.0)
Hemoglobin: 13.4 g/dL (ref 12.0–15.0)
Lymphocytes Relative: 19.6 % (ref 12.0–46.0)
Lymphs Abs: 1.3 10*3/uL (ref 0.7–4.0)
MCHC: 33.1 g/dL (ref 30.0–36.0)
MCV: 90.7 fl (ref 78.0–100.0)
Monocytes Absolute: 0.4 10*3/uL (ref 0.1–1.0)
Monocytes Relative: 6.7 % (ref 3.0–12.0)
Neutro Abs: 4.4 10*3/uL (ref 1.4–7.7)
Neutrophils Relative %: 65.6 % (ref 43.0–77.0)
Platelets: 261 10*3/uL (ref 150.0–400.0)
RBC: 4.45 Mil/uL (ref 3.87–5.11)
RDW: 13.1 % (ref 11.5–15.5)
WBC: 6.7 10*3/uL (ref 4.0–10.5)

## 2021-02-06 LAB — LIPID PANEL
Cholesterol: 153 mg/dL (ref 0–200)
HDL: 49.7 mg/dL (ref >39.00)
LDL Cholesterol: 79 mg/dL (ref 0–99)
NonHDL: 102.93
Total CHOL/HDL Ratio: 3
Triglycerides: 122 mg/dL (ref 0.0–149.0)
VLDL: 24.4 mg/dL (ref 0.0–40.0)

## 2021-02-06 LAB — BASIC METABOLIC PANEL
BUN: 14 mg/dL (ref 6–23)
CO2: 28 mEq/L (ref 19–32)
Calcium: 9.2 mg/dL (ref 8.4–10.5)
Chloride: 105 mEq/L (ref 96–112)
Creatinine, Ser: 1.07 mg/dL (ref 0.40–1.20)
GFR: 48.7 mL/min — ABNORMAL LOW (ref >60.00)
Glucose, Bld: 87 mg/dL (ref 70–99)
Potassium: 4.1 mEq/L (ref 3.5–5.1)
Sodium: 141 mEq/L (ref 135–145)

## 2021-02-06 LAB — HEPATIC FUNCTION PANEL
ALT: 15 U/L (ref 0–35)
AST: 20 U/L (ref 0–37)
Albumin: 3.8 g/dL (ref 3.5–5.2)
Alkaline Phosphatase: 60 U/L (ref 39–117)
Bilirubin, Direct: 0.1 mg/dL (ref 0.0–0.3)
Total Bilirubin: 0.5 mg/dL (ref 0.2–1.2)
Total Protein: 6.3 g/dL (ref 6.0–8.3)

## 2021-02-07 ENCOUNTER — Encounter: Payer: Self-pay | Admitting: Internal Medicine

## 2021-02-07 ENCOUNTER — Ambulatory Visit (INDEPENDENT_AMBULATORY_CARE_PROVIDER_SITE_OTHER): Payer: Medicare HMO | Admitting: Internal Medicine

## 2021-02-07 DIAGNOSIS — E7800 Pure hypercholesterolemia, unspecified: Secondary | ICD-10-CM

## 2021-02-07 DIAGNOSIS — R1031 Right lower quadrant pain: Secondary | ICD-10-CM | POA: Diagnosis not present

## 2021-02-07 DIAGNOSIS — Z79899 Other long term (current) drug therapy: Secondary | ICD-10-CM

## 2021-02-07 DIAGNOSIS — R9389 Abnormal findings on diagnostic imaging of other specified body structures: Secondary | ICD-10-CM

## 2021-02-07 DIAGNOSIS — N200 Calculus of kidney: Secondary | ICD-10-CM

## 2021-02-07 LAB — URINALYSIS, ROUTINE W REFLEX MICROSCOPIC
Bilirubin Urine: NEGATIVE
Hgb urine dipstick: NEGATIVE
Ketones, ur: NEGATIVE
Leukocytes,Ua: NEGATIVE
Nitrite: NEGATIVE
RBC / HPF: NONE SEEN (ref 0–?)
Specific Gravity, Urine: 1.025 (ref 1.000–1.030)
Total Protein, Urine: NEGATIVE
Urine Glucose: NEGATIVE
Urobilinogen, UA: 0.2 (ref 0.0–1.0)
WBC, UA: NONE SEEN (ref 0–?)
pH: 7.5 (ref 5.0–8.0)

## 2021-02-07 MED ORDER — MEDROXYPROGESTERONE ACETATE 2.5 MG PO TABS
2.5000 mg | ORAL_TABLET | Freq: Every day | ORAL | 1 refills | Status: DC
Start: 2021-02-07 — End: 2022-03-01

## 2021-02-07 NOTE — Assessment & Plan Note (Signed)
Has a history of kidney stone.  Symptoms as outlined.  Right side and RLQ pain.  Concern regarding possible kidney stone.  Discussed further w/up.  She is agreeable to CT (renal protocol), but does not want to have done until 03/21/21.  Check urine.  Schedule CT.  Follow.  Feeling better.

## 2021-02-07 NOTE — Assessment & Plan Note (Signed)
On simvastatin.  Low cholesterol diet and exercise.  Follow lipid panel and liver function tests.   

## 2021-02-07 NOTE — Assessment & Plan Note (Signed)
Previously worked up by oncology.  Last CT stable.  Again discussed f/u CT.  She declines.  Breathing stable.

## 2021-02-07 NOTE — Progress Notes (Addendum)
Patient ID: Kristin Phelps, female   DOB: [birth year], 80 y.o.   MRN: [id number]   Subjective:    Patient ID: Kristin Phelps, female    DOB: 1941/11/14, 80 y.o.   MRN: [id number]  HPI This visit occurred during the SARS-CoV-2 public health emergency.  Safety protocols were in place, including screening questions prior to the visit, additional usage of staff PPE, and extensive cleaning of exam room while observing appropriate contact time as indicated for disinfecting solutions.  Patient here for a scheduled follow up.  Here to follow up regarding her cholesterol. Reports that two weeks ago, she was having increased right side pain.  Increased pain.  Was not evaluated.  No hematuria.  No dysuria.  Describes pain - right side, right lower quadrant pain and occasional low back pain.  Increased pain previously.  Hurt to bend over and put on socks.  Pain is better now.  Eating.  No nausea.  No vomiting.  No bowel change.  Has a history of kidney stones.  Feels similar to previous stone.  Breathing stable.  No chest pain.  No fever.     Past Medical History:  Diagnosis Date  . Arthritis   . Cancer ([place])    basal cell  . Hyperlipidemia   . Nephrolithiasis   . Postmenopausal HRT (hormone replacement therapy)   . Pulmonary nodules   . Raynaud's phenomenon   . Renal stone    Past Surgical History:  Procedure Laterality Date  . anal fissure surgery  9/06  . LITHOTRIPSY    . TONSILLECTOMY     Family History  Problem Relation Age of Onset  . Breast cancer Mother        died age 71  . Heart disease Father        s/p CABG  . Aneurysm Father   . Breast cancer Other        maternal niece  . Colon cancer Neg Hx    Social History   Socioeconomic History  . Marital status: Married    Spouse name: Kristin Phelps  . Number of children: 2  . Years of education: Not on file  . Highest education level: Not on file  Occupational History    Employer: [employer]  Tobacco Use  . Smoking  status: Never Smoker  . Smokeless tobacco: Never Used  Substance and Sexual Activity  . Alcohol use: No    Alcohol/week: 0.0 standard drinks  . Drug use: No  . Sexual activity: Not Currently  Other Topics Concern  . Not on file  Social History Narrative  . Not on file   Social Determinants of Health   Financial Resource Strain: Not on file  Food Insecurity: Not on file  Transportation Needs: Not on file  Physical Activity: Not on file  Stress: Not on file  Social Connections: Not on file    Outpatient Encounter Medications as of 02/07/2021  Medication Sig  . Cholecalciferol (VITAMIN D-3) 1000 UNITS CAPS Take 1 capsule by mouth daily.  Marland Kitchen estradiol (ESTRACE) 1 MG tablet Take 1 tablet (1 mg total) by mouth daily.  . medroxyPROGESTERone (PROVERA) 2.5 MG tablet Take 1 tablet (2.5 mg total) by mouth daily.  . polycarbophil (FIBERCON) 625 MG tablet Take 625 mg by mouth daily.  . simvastatin (ZOCOR) 20 MG tablet Take 1 tablet (20 mg total) by mouth daily.  . [DISCONTINUED] medroxyPROGESTERone (PROVERA) 2.5 MG tablet Take 1 tablet by mouth once daily  . [DISCONTINUED]  nystatin cream (MYCOSTATIN) Apply 1 application topically 2 (two) times daily.   No facility-administered encounter medications on file as of 02/07/2021.    Review of Systems  Constitutional: Negative for appetite change and unexpected weight change.  HENT: Negative for congestion.   Respiratory: Negative for cough and chest tightness.        Breathing stable.   Cardiovascular: Negative for chest pain, palpitations and leg swelling.  Gastrointestinal: Negative for diarrhea, nausea and vomiting.       Right lower quadrant pain and right side pain.   Genitourinary: Negative for difficulty urinating and dysuria.  Musculoskeletal: Negative for joint swelling and myalgias.  Skin: Negative for color change and rash.  Neurological: Negative for dizziness, light-headedness and headaches.  Psychiatric/Behavioral: Negative for  agitation and dysphoric mood.       Objective:    Physical Exam Vitals reviewed.  Constitutional:      General: She is not in acute distress.    Appearance: Normal appearance.  HENT:     Head: Normocephalic and atraumatic.     Right Ear: External ear normal.     Left Ear: External ear normal.     Mouth/Throat:     Mouth: Oropharynx is clear and moist.  Eyes:     General: No scleral icterus.       Right eye: No discharge.        Left eye: No discharge.     Conjunctiva/sclera: Conjunctivae normal.  Neck:     Thyroid: No thyromegaly.  Cardiovascular:     Rate and Rhythm: Normal rate and regular rhythm.  Pulmonary:     Effort: No respiratory distress.     Breath sounds: Normal breath sounds. No wheezing.  Abdominal:     General: Bowel sounds are normal.     Palpations: Abdomen is soft.     Comments: Tenderness to palpation - lower abdomen.   Musculoskeletal:        General: No swelling, tenderness or edema.     Cervical back: Neck supple. No tenderness.  Lymphadenopathy:     Cervical: No cervical adenopathy.  Skin:    Findings: No erythema or rash.  Neurological:     Mental Status: She is alert.  Psychiatric:        Mood and Affect: Mood normal.        Behavior: Behavior normal.     BP 122/72   Pulse 64   Temp 98.3 F (36.8 C) (Oral)   Resp 16   Ht 5\' 4"  (1.626 m)   Wt 157 lb 12.8 oz (71.6 kg)   SpO2 99%   BMI 27.09 kg/m  Wt Readings from Last 3 Encounters:  02/07/21 157 lb 12.8 oz (71.6 kg)  10/04/20 161 lb 3.2 oz (73.1 kg)  03/01/20 152 lb 12.8 oz (69.3 kg)     Lab Results  Component Value Date   WBC 6.7 02/06/2021   HGB 13.4 02/06/2021   HCT 40.4 02/06/2021   PLT 261.0 02/06/2021   GLUCOSE 87 02/06/2021   CHOL 153 02/06/2021   TRIG 122.0 02/06/2021   HDL 49.70 02/06/2021   LDLCALC 79 02/06/2021   ALT 15 02/06/2021   AST 20 02/06/2021   NA 141 02/06/2021   K 4.1 02/06/2021   CL 105 02/06/2021   CREATININE 1.07 02/06/2021   BUN 14  02/06/2021   CO2 28 02/06/2021   TSH 3.50 03/01/2020    MM 3D SCREEN BREAST BILATERAL  Result Date: 11/17/2020 CLINICAL DATA:  Screening. EXAM: DIGITAL SCREENING BILATERAL MAMMOGRAM WITH TOMO AND CAD COMPARISON:  Previous exam(s). ACR Breast Density Category c: The breast tissue is heterogeneously dense, which may obscure small masses. FINDINGS: There are no findings suspicious for malignancy. Images were processed with CAD. IMPRESSION: No mammographic evidence of malignancy. A result letter of this screening mammogram will be mailed directly to the patient. RECOMMENDATION: Screening mammogram in one year. (Code:SM-B-01Y) BI-RADS CATEGORY  1: Negative. Electronically Signed   By: Everlean Alstrom M.D.   On: 11/17/2020 11:26       Assessment & Plan:   Problem List Items Addressed This Visit    Abnormal chest CT    Previously worked up by oncology.  Last CT stable.  Again discussed f/u CT.  She declines.  Breathing stable.       Current use of estrogen therapy    Discussed.  Wants to remain on estrogen.       Hypercholesterolemia    On simvastatin.  Low cholesterol diet and exercise.  Follow lipid panel and liver function tests.        Relevant Orders   TSH   Basic metabolic panel   Nephrolithiasis    Has a history of kidney stone.  Symptoms as outlined.  Right side and RLQ pain.  Concern regarding possible kidney stone.  Discussed further w/up.  She is agreeable to CT (renal protocol), but does not want to have done until 03/21/21.  Check urine.  Schedule CT.  Follow.  Feeling better.       Right lower quadrant pain    Right lower quadrant pain as outlined.  History of kidney stone.  Concern regarding possible stone.  Discussed further w/up.  Schedule CT scan.  Check urine.       Relevant Orders   Urinalysis, Routine w reflex microscopic (Completed)   CT RENAL STONE STUDY    Other Visit Diagnoses    Right lower quadrant abdominal pain       Relevant Orders   CT RENAL STONE  STUDY       Einar Pheasant, MD

## 2021-02-07 NOTE — Assessment & Plan Note (Signed)
Discussed.  Wants to remain on estrogen.

## 2021-02-07 NOTE — Assessment & Plan Note (Signed)
Right lower quadrant pain as outlined.  History of kidney stone.  Concern regarding possible stone.  Discussed further w/up.  Schedule CT scan.  Check urine.

## 2021-02-08 ENCOUNTER — Telehealth: Payer: Self-pay | Admitting: Internal Medicine

## 2021-02-08 NOTE — Telephone Encounter (Signed)
lft vm for pt with scheduled appt on 03/21/2021 [facility] Thanks

## 2021-02-24 ENCOUNTER — Telehealth: Payer: Self-pay | Admitting: *Deleted

## 2021-02-24 NOTE — Telephone Encounter (Signed)
Please place future orders for lab appt.  

## 2021-02-25 NOTE — Telephone Encounter (Signed)
Orders placed for non fasting labs.

## 2021-02-25 NOTE — Addendum Note (Signed)
Addended by: Alisa Graff on: 02/25/2021 04:40 AM   Modules accepted: Orders

## 2021-02-28 ENCOUNTER — Other Ambulatory Visit (INDEPENDENT_AMBULATORY_CARE_PROVIDER_SITE_OTHER): Payer: Medicare HMO

## 2021-02-28 ENCOUNTER — Other Ambulatory Visit: Payer: Self-pay

## 2021-02-28 DIAGNOSIS — E7800 Pure hypercholesterolemia, unspecified: Secondary | ICD-10-CM

## 2021-02-28 LAB — BASIC METABOLIC PANEL
BUN: 17 mg/dL (ref 6–23)
CO2: 29 mEq/L (ref 19–32)
Calcium: 9.3 mg/dL (ref 8.4–10.5)
Chloride: 105 mEq/L (ref 96–112)
Creatinine, Ser: 1.01 mg/dL (ref 0.40–1.20)
GFR: 52.17 mL/min — ABNORMAL LOW (ref >60.00)
Glucose, Bld: 84 mg/dL (ref 70–99)
Potassium: 3.5 mEq/L (ref 3.5–5.1)
Sodium: 141 mEq/L (ref 135–145)

## 2021-02-28 LAB — TSH: TSH: 3.46 u[IU]/mL (ref 0.35–4.50)

## 2021-03-16 ENCOUNTER — Telehealth: Payer: Self-pay | Admitting: Internal Medicine

## 2021-03-16 NOTE — Telephone Encounter (Signed)
Left message with pt spouse for patient to call back and schedule Medicare Annual Wellness Visit (AWV)   Please schedule on the [facility] template  Last AWV 9/32/67; please schedule at anytime   This should be a 45 minute visit

## 2021-03-21 ENCOUNTER — Other Ambulatory Visit: Payer: Self-pay

## 2021-03-21 ENCOUNTER — Ambulatory Visit
Admission: RE | Admit: 2021-03-21 | Discharge: 2021-03-21 | Disposition: A | Discharge status: Home / Self Care | Payer: Medicare HMO | Source: Ambulatory Visit | Attending: Internal Medicine | Admitting: Internal Medicine

## 2021-03-21 DIAGNOSIS — I700 Atherosclerosis of aorta: Secondary | ICD-10-CM | POA: Diagnosis not present

## 2021-03-21 DIAGNOSIS — N83201 Unspecified ovarian cyst, right side: Secondary | ICD-10-CM | POA: Diagnosis not present

## 2021-03-21 DIAGNOSIS — R1031 Right lower quadrant pain: Secondary | ICD-10-CM | POA: Diagnosis not present

## 2021-03-21 DIAGNOSIS — R109 Unspecified abdominal pain: Secondary | ICD-10-CM | POA: Diagnosis not present

## 2021-03-21 DIAGNOSIS — Q630 Accessory kidney: Secondary | ICD-10-CM | POA: Diagnosis not present

## 2021-04-03 ENCOUNTER — Other Ambulatory Visit: Payer: Self-pay | Admitting: Internal Medicine

## 2021-05-16 ENCOUNTER — Other Ambulatory Visit: Payer: Self-pay

## 2021-05-16 ENCOUNTER — Ambulatory Visit (INDEPENDENT_AMBULATORY_CARE_PROVIDER_SITE_OTHER): Payer: Medicare HMO | Admitting: Internal Medicine

## 2021-05-16 VITALS — BP 118/62 | HR 61 | Temp 98.1°F | Resp 16 | Ht 64.0 in | Wt 159.0 lb

## 2021-05-16 DIAGNOSIS — N83201 Unspecified ovarian cyst, right side: Secondary | ICD-10-CM

## 2021-05-16 DIAGNOSIS — N83209 Unspecified ovarian cyst, unspecified side: Secondary | ICD-10-CM | POA: Insufficient documentation

## 2021-05-16 DIAGNOSIS — Z79899 Other long term (current) drug therapy: Secondary | ICD-10-CM

## 2021-05-16 DIAGNOSIS — I700 Atherosclerosis of aorta: Secondary | ICD-10-CM | POA: Diagnosis not present

## 2021-05-16 DIAGNOSIS — E7800 Pure hypercholesterolemia, unspecified: Secondary | ICD-10-CM | POA: Diagnosis not present

## 2021-05-16 DIAGNOSIS — N200 Calculus of kidney: Secondary | ICD-10-CM | POA: Diagnosis not present

## 2021-05-16 DIAGNOSIS — R9389 Abnormal findings on diagnostic imaging of other specified body structures: Secondary | ICD-10-CM

## 2021-05-16 LAB — LIPID PANEL
Cholesterol: 172 mg/dL (ref 0–200)
HDL: 52.1 mg/dL (ref >39.00)
LDL Cholesterol: 99 mg/dL (ref 0–99)
NonHDL: 120.22
Total CHOL/HDL Ratio: 3
Triglycerides: 108 mg/dL (ref 0.0–149.0)
VLDL: 21.6 mg/dL (ref 0.0–40.0)

## 2021-05-16 LAB — BASIC METABOLIC PANEL
BUN: 18 mg/dL (ref 6–23)
CO2: 28 mEq/L (ref 19–32)
Calcium: 9.4 mg/dL (ref 8.4–10.5)
Chloride: 105 mEq/L (ref 96–112)
Creatinine, Ser: 1.1 mg/dL (ref 0.40–1.20)
GFR: 47.02 mL/min — ABNORMAL LOW (ref >60.00)
Glucose, Bld: 77 mg/dL (ref 70–99)
Potassium: 4.7 mEq/L (ref 3.5–5.1)
Sodium: 141 mEq/L (ref 135–145)

## 2021-05-16 LAB — HEPATIC FUNCTION PANEL
ALT: 19 U/L (ref 0–35)
AST: 22 U/L (ref 0–37)
Albumin: 4.3 g/dL (ref 3.5–5.2)
Alkaline Phosphatase: 64 U/L (ref 39–117)
Bilirubin, Direct: 0.1 mg/dL (ref 0.0–0.3)
Total Bilirubin: 0.5 mg/dL (ref 0.2–1.2)
Total Protein: 6.5 g/dL (ref 6.0–8.3)

## 2021-05-16 NOTE — Progress Notes (Addendum)
Patient ID: Kristin Phelps, female   DOB: 07-19-1941, 80 y.o.   MRN: [id number]   Subjective:    Patient ID: Kristin Phelps, female    DOB: 1941-01-19, 80 y.o.   MRN: [id number]  HPI This visit occurred during the SARS-CoV-2 public health emergency.  Safety protocols were in place, including screening questions prior to the visit, additional usage of staff PPE, and extensive cleaning of exam room while observing appropriate contact time as indicated for disinfecting solutions.   Patient here for a scheduled follow up.  Here to follow up regarding her cholesterol.  Also was recently seen for lower abdominal pain.  Had recent CT scan 03/21/2021.  CT of abdomen revealed no acute findings in the abdomen or pelvis.  Did reveal atherosclerosis.  Also mention 1.6 cm right ovarian cyst.  Left adnexa unremarkable.  Discussed need for further follow-up of the ovarian cyst.  Discussed not desired to have GYN evaluate.  She denies any abdominal pain currently.  Eating and drinking well.  No nausea or vomiting.  No chest pain.  Feels her breathing is stable.  Blood pressure doing well.   Past Medical History:  Diagnosis Date   Arthritis    Cancer ([place])    basal cell   Hyperlipidemia    Nephrolithiasis    Postmenopausal HRT (hormone replacement therapy)    Pulmonary nodules    Raynaud's phenomenon    Renal stone    Past Surgical History:  Procedure Laterality Date   anal fissure surgery  9/06   LITHOTRIPSY     TONSILLECTOMY     Family History  Problem Relation Age of Onset   Breast cancer Mother        died age 31   Heart disease Father        s/p CABG   Aneurysm Father    Breast cancer Other        maternal niece   Colon cancer Neg Hx    Social History   Socioeconomic History   Marital status: Married    Spouse name: Tom   Number of children: 2   Years of education: Not on file   Highest education level: Not on file  Occupational History    Employer: [employer]   Tobacco Use   Smoking status: Never   Smokeless tobacco: Never  Substance and Sexual Activity   Alcohol use: No    Alcohol/week: 0.0 standard drinks   Drug use: No   Sexual activity: Not Currently  Other Topics Concern   Not on file  Social History Narrative   Not on file   Social Determinants of Health   Financial Resource Strain: Not on file  Food Insecurity: Not on file  Transportation Needs: Not on file  Physical Activity: Not on file  Stress: Not on file  Social Connections: Not on file    Outpatient Encounter Medications as of 05/16/2021  Medication Sig   Cholecalciferol (VITAMIN D-3) 1000 UNITS CAPS Take 1 capsule by mouth daily.   estradiol (ESTRACE) 1 MG tablet Take 1 tablet (1 mg total) by mouth daily.   medroxyPROGESTERone (PROVERA) 2.5 MG tablet Take 1 tablet (2.5 mg total) by mouth daily.   polycarbophil (FIBERCON) 625 MG tablet Take 625 mg by mouth daily.   simvastatin (ZOCOR) 20 MG tablet Take 1 tablet by mouth once daily   No facility-administered encounter medications on file as of 05/16/2021.     Review of Systems  Constitutional:  Negative  for appetite change and unexpected weight change.  HENT:  Negative for congestion.   Respiratory:  Negative for cough and chest tightness.        Breathing stable.   Cardiovascular:  Negative for chest pain, palpitations and leg swelling.  Gastrointestinal:  Negative for abdominal pain, diarrhea, nausea and vomiting.  Genitourinary:  Negative for difficulty urinating and dysuria.  Musculoskeletal:  Negative for joint swelling and myalgias.  Skin:  Negative for color change and rash.  Neurological:  Negative for dizziness, light-headedness and headaches.  Psychiatric/Behavioral:  Negative for agitation and dysphoric mood.       Objective:    Physical Exam Vitals reviewed.  Constitutional:      General: She is not in acute distress.    Appearance: Normal appearance.  HENT:     Head: Normocephalic and  atraumatic.     Right Ear: External ear normal.     Left Ear: External ear normal.  Eyes:     General: No scleral icterus.       Right eye: No discharge.        Left eye: No discharge.     Conjunctiva/sclera: Conjunctivae normal.  Neck:     Thyroid: No thyromegaly.  Cardiovascular:     Rate and Rhythm: Normal rate and regular rhythm.  Pulmonary:     Effort: No respiratory distress.     Breath sounds: Normal breath sounds. No wheezing.  Abdominal:     General: Bowel sounds are normal.     Palpations: Abdomen is soft.     Tenderness: There is no abdominal tenderness.  Musculoskeletal:        General: No swelling or tenderness.     Cervical back: Neck supple. No tenderness.  Lymphadenopathy:     Cervical: No cervical adenopathy.  Skin:    Findings: No erythema or rash.  Neurological:     Mental Status: She is alert.  Psychiatric:        Mood and Affect: Mood normal.        Behavior: Behavior normal.    BP 118/62   Pulse 61   Temp 98.1 F (36.7 C)   Resp 16   Ht 5\' 4"  (1.626 m)   Wt 159 lb (72.1 kg)   SpO2 99%   BMI 27.29 kg/m  Wt Readings from Last 3 Encounters:  05/16/21 159 lb (72.1 kg)  02/07/21 157 lb 12.8 oz (71.6 kg)  10/04/20 161 lb 3.2 oz (73.1 kg)     Lab Results  Component Value Date   WBC 6.7 02/06/2021   HGB 13.4 02/06/2021   HCT 40.4 02/06/2021   PLT 261.0 02/06/2021   GLUCOSE 77 05/16/2021   CHOL 172 05/16/2021   TRIG 108.0 05/16/2021   HDL 52.10 05/16/2021   LDLCALC 99 05/16/2021   ALT 19 05/16/2021   AST 22 05/16/2021   NA 141 05/16/2021   K 4.7 05/16/2021   CL 105 05/16/2021   CREATININE 1.10 05/16/2021   BUN 18 05/16/2021   CO2 28 05/16/2021   TSH 3.46 02/28/2021    CT RENAL STONE STUDY  Result Date: 03/22/2021 CLINICAL DATA:  Two months of right flank pain, renal stone suspected. EXAM: CT ABDOMEN AND PELVIS WITHOUT CONTRAST TECHNIQUE: Multidetector CT imaging of the abdomen and pelvis was performed following the standard  protocol without IV contrast. COMPARISON:  None. FINDINGS: Lower chest: No acute abnormality. Hepatobiliary: Unremarkable noncontrast appearance of the hepatic parenchyma. Gallbladder is grossly unremarkable. No biliary ductal dilation. Pancreas:  Unremarkable. No pancreatic ductal dilatation or surrounding inflammatory changes. Spleen: Normal in size without focal abnormality. Adrenals/Urinary Tract: Bilateral adrenal glands are unremarkable. No hydronephrosis. No renal, ureteral or nephrolithiasis. The right kidney is rotated. Urinary bladder is grossly unremarkable for degree of distension. Stomach/Bowel: Stomach is grossly unremarkable. No suspicious small bowel wall thickening or dilation. Appendix appears within normal limits. No suspicious colonic wall thickening or mass like lesions. Vascular/Lymphatic: Aortic atherosclerosis. No abdominal aortic aneurysm. No pathologically enlarged abdominal or pelvic lymph nodes. Reproductive: Uterus is unremarkable. Left adnexa is unremarkable. 1.6 cm right ovarian cyst. No follow-up imaging recommended. Note: This recommendation does not apply to premenarchal patients and to those with increased risk (genetic, family history, elevated tumor markers or other high-risk factors) of ovarian cancer. Reference: JACR 2020 Feb; 17(2):248-254 Other: No abdominopelvic ascites. Musculoskeletal: Multilevel degenerative changes spine. No acute osseous abnormality. No aggressive lytic or blastic lesion of bone. IMPRESSION: 1. No acute findings in the abdomen or pelvis. Specifically, no evidence of obstructive uropathy. 2. Aortic atherosclerosis. Aortic Atherosclerosis (ICD10-I70.0). Electronically Signed   By: Dahlia Bailiff MD   On: 03/22/2021 09:05       Assessment & Plan:   Problem List Items Addressed This Visit     Abnormal chest CT    Previously worked up by oncology.  Last CT scan reported stable.  Discussed again with her regarding follow-up CT scan.  Breathing stable.   She declines any further work-up.  Will notify me if she change her mind.       Aortic atherosclerosis ([facility])    Continue simvastatin.        Current use of estrogen therapy    Wants to remain on estrogen.       Hypercholesterolemia - Primary    On simvastatin.  Low-cholesterol diet and exercise.  Check fasting lipid profile and liver panel.  If elevation.  Consider change to Crestor.       Relevant Orders   Lipid panel (Completed)   Hepatic function panel (Completed)   Basic metabolic panel (Completed)   Nephrolithiasis    Has a history of kidney stone.  CT scan recently as outlined.  No pain.  .       Ovarian cyst    Discussed further evaluation of the ovarian cyst.  Discussed my desire for GYN referral.  She requested to have CA125 drawn.  Ordered.  Initially agreed to GYN referral.  Later called back and declined.  She will notify me if she changes her mind.       Relevant Orders   CA 125 (Completed)     Einar Pheasant, MD

## 2021-05-17 ENCOUNTER — Telehealth: Payer: Self-pay | Admitting: Internal Medicine

## 2021-05-17 LAB — CA 125: CA 125: 6 U/mL (ref <35)

## 2021-05-17 NOTE — Telephone Encounter (Signed)
Patient spoke to [facility] about her labs. She called and would like to speak to him again.

## 2021-05-18 NOTE — Telephone Encounter (Signed)
Received a call from [facility] [place] to answer questions from lab results. Kristin Phelps states that she does not want to start the crestor medication as she was not informed that she had to watch her diet while on cholesterol medication. She will stay with the simvastatin and watch her diet to see if that improves her results. She also declines a referral to Dr.Beasley. She states that her labs are good and she does not want to see a specialist at this time.

## 2021-05-28 ENCOUNTER — Encounter: Payer: Self-pay | Admitting: Internal Medicine

## 2021-05-28 DIAGNOSIS — I700 Atherosclerosis of aorta: Secondary | ICD-10-CM | POA: Insufficient documentation

## 2021-05-28 NOTE — Assessment & Plan Note (Signed)
Previously worked up by oncology.  Last CT scan reported stable.  Discussed again with her regarding follow-up CT scan.  Breathing stable.  She declines any further work-up.  Will notify me if she change her mind. 

## 2021-05-28 NOTE — Assessment & Plan Note (Signed)
Has a history of kidney stone.  CT scan recently as outlined.  No pain.  Marland Kitchen

## 2021-05-28 NOTE — Assessment & Plan Note (Signed)
On simvastatin.  Low-cholesterol diet and exercise.  Check fasting lipid profile and liver panel.  If elevation.  Consider change to Crestor.

## 2021-05-28 NOTE — Assessment & Plan Note (Signed)
Continue simvastatin. 

## 2021-05-28 NOTE — Assessment & Plan Note (Signed)
Wants to remain on estrogen.

## 2021-05-28 NOTE — Assessment & Plan Note (Signed)
Discussed further evaluation of the ovarian cyst.  Discussed my desire for GYN referral.  She requested to have CA125 drawn.  Ordered.  Initially agreed to GYN referral.  Later called back and declined.  She will notify me if she changes her mind.

## 2021-06-19 ENCOUNTER — Other Ambulatory Visit: Payer: Self-pay | Admitting: Internal Medicine

## 2021-07-18 DIAGNOSIS — H5213 Myopia, bilateral: Secondary | ICD-10-CM | POA: Diagnosis not present

## 2021-08-15 DIAGNOSIS — H26493 Other secondary cataract, bilateral: Secondary | ICD-10-CM | POA: Diagnosis not present

## 2021-08-15 DIAGNOSIS — H18413 Arcus senilis, bilateral: Secondary | ICD-10-CM | POA: Diagnosis not present

## 2021-08-15 DIAGNOSIS — H26492 Other secondary cataract, left eye: Secondary | ICD-10-CM | POA: Diagnosis not present

## 2021-08-15 DIAGNOSIS — Z961 Presence of intraocular lens: Secondary | ICD-10-CM | POA: Diagnosis not present

## 2021-08-15 DIAGNOSIS — H18593 Other hereditary corneal dystrophies, bilateral: Secondary | ICD-10-CM | POA: Diagnosis not present

## 2021-08-23 DIAGNOSIS — H26492 Other secondary cataract, left eye: Secondary | ICD-10-CM | POA: Diagnosis not present

## 2021-08-29 ENCOUNTER — Ambulatory Visit (INDEPENDENT_AMBULATORY_CARE_PROVIDER_SITE_OTHER): Payer: Medicare HMO | Admitting: Internal Medicine

## 2021-08-29 ENCOUNTER — Ambulatory Visit (INDEPENDENT_AMBULATORY_CARE_PROVIDER_SITE_OTHER): Payer: Medicare HMO

## 2021-08-29 ENCOUNTER — Other Ambulatory Visit: Payer: Self-pay

## 2021-08-29 VITALS — BP 126/74 | HR 68 | Temp 97.6°F | Resp 16 | Ht 64.0 in | Wt 162.4 lb

## 2021-08-29 DIAGNOSIS — Z1231 Encounter for screening mammogram for malignant neoplasm of breast: Secondary | ICD-10-CM | POA: Diagnosis not present

## 2021-08-29 DIAGNOSIS — M542 Cervicalgia: Secondary | ICD-10-CM | POA: Diagnosis not present

## 2021-08-29 DIAGNOSIS — E7800 Pure hypercholesterolemia, unspecified: Secondary | ICD-10-CM

## 2021-08-29 DIAGNOSIS — R9389 Abnormal findings on diagnostic imaging of other specified body structures: Secondary | ICD-10-CM

## 2021-08-29 DIAGNOSIS — I700 Atherosclerosis of aorta: Secondary | ICD-10-CM | POA: Diagnosis not present

## 2021-08-29 DIAGNOSIS — N83201 Unspecified ovarian cyst, right side: Secondary | ICD-10-CM | POA: Diagnosis not present

## 2021-08-29 DIAGNOSIS — H26491 Other secondary cataract, right eye: Secondary | ICD-10-CM | POA: Diagnosis not present

## 2021-08-29 LAB — LIPID PANEL
Cholesterol: 171 mg/dL (ref 0–200)
HDL: 49.7 mg/dL (ref >39.00)
LDL Cholesterol: 99 mg/dL (ref 0–99)
NonHDL: 121.4
Total CHOL/HDL Ratio: 3
Triglycerides: 110 mg/dL (ref 0.0–149.0)
VLDL: 22 mg/dL (ref 0.0–40.0)

## 2021-08-29 LAB — BASIC METABOLIC PANEL
BUN: 18 mg/dL (ref 6–23)
CO2: 28 mEq/L (ref 19–32)
Calcium: 9.3 mg/dL (ref 8.4–10.5)
Chloride: 104 mEq/L (ref 96–112)
Creatinine, Ser: 1.08 mg/dL (ref 0.40–1.20)
GFR: 47.97 mL/min — ABNORMAL LOW (ref >60.00)
Glucose, Bld: 84 mg/dL (ref 70–99)
Potassium: 4.7 mEq/L (ref 3.5–5.1)
Sodium: 139 mEq/L (ref 135–145)

## 2021-08-29 LAB — HEPATIC FUNCTION PANEL
ALT: 15 U/L (ref 0–35)
AST: 18 U/L (ref 0–37)
Albumin: 4.2 g/dL (ref 3.5–5.2)
Alkaline Phosphatase: 61 U/L (ref 39–117)
Bilirubin, Direct: 0.1 mg/dL (ref 0.0–0.3)
Total Bilirubin: 0.6 mg/dL (ref 0.2–1.2)
Total Protein: 6.4 g/dL (ref 6.0–8.3)

## 2021-08-29 NOTE — Progress Notes (Signed)
Patient ID: Kristin Phelps, female   DOB: 1941-10-31, 80 y.o.   MRN: [id number]   Subjective:    Patient ID: Kristin Phelps, female    DOB: Sep 04, 1941, 80 y.o.   MRN: [id number]  This visit occurred during the SARS-CoV-2 public health emergency.  Safety protocols were in place, including screening questions prior to the visit, additional usage of staff PPE, and extensive cleaning of exam room while observing appropriate contact time as indicated for disinfecting solutions.   Patient here for scheduled follow up.   Chief Complaint  Patient presents with   Headache   Ear Fullness   Hyperlipidemia   .   HPI She reports she is doing relatively well.  Is having pain/headaches - dull pain.  On questioning - pain in posterior neck extending up into head with looking to left (and some to right) and looking up.  No dizziness.  Had left eye procedure two weeks ago.  Right eye procedure today.  Has been using debrox.  Desires to have ears irrigated today.  Previous itching - resolved.  No rash.  Discussed previous ovarian cyst.  Discussed gyn referral.  Discussed labs and changing statin to crestor.    Past Medical History:  Diagnosis Date   Arthritis    Cancer ([place])    basal cell   Hyperlipidemia    Nephrolithiasis    Postmenopausal HRT (hormone replacement therapy)    Pulmonary nodules    Raynaud's phenomenon    Renal stone    Past Surgical History:  Procedure Laterality Date   anal fissure surgery  9/06   LITHOTRIPSY     TONSILLECTOMY     Family History  Problem Relation Age of Onset   Breast cancer Mother        died age 20   Heart disease Father        s/p CABG   Aneurysm Father    Breast cancer Other        maternal niece   Colon cancer Neg Hx    Social History   Socioeconomic History   Marital status: Married    Spouse name: Tom   Number of children: 2   Years of education: Not on file   Highest education level: Not on file  Occupational History    Employer:  [employer]  Tobacco Use   Smoking status: Never   Smokeless tobacco: Never  Substance and Sexual Activity   Alcohol use: No    Alcohol/week: 0.0 standard drinks   Drug use: No   Sexual activity: Not Currently  Other Topics Concern   Not on file  Social History Narrative   Not on file   Social Determinants of Health   Financial Resource Strain: Not on file  Food Insecurity: Not on file  Transportation Needs: Not on file  Physical Activity: Not on file  Stress: Not on file  Social Connections: Not on file     Review of Systems  Constitutional:  Negative for appetite change and unexpected weight change.  HENT:  Negative for congestion and sinus pressure.   Respiratory:  Negative for cough, chest tightness and shortness of breath.   Cardiovascular:  Negative for chest pain, palpitations and leg swelling.  Gastrointestinal:  Negative for abdominal pain, diarrhea, nausea and vomiting.  Genitourinary:  Negative for difficulty urinating and dysuria.  Musculoskeletal:  Positive for neck pain. Negative for joint swelling and myalgias.  Skin:  Negative for color change and rash.  Neurological:  Positive for headaches. Negative for dizziness and light-headedness.  Psychiatric/Behavioral:  Negative for agitation and dysphoric mood.       Objective:     BP 126/74   Pulse 68   Temp 97.6 F (36.4 C)   Resp 16   Ht 5\' 4"  (1.626 m)   Wt 162 lb 6.4 oz (73.7 kg)   SpO2 98%   BMI 27.88 kg/m  Wt Readings from Last 3 Encounters:  08/29/21 162 lb 6.4 oz (73.7 kg)  05/16/21 159 lb (72.1 kg)  02/07/21 157 lb 12.8 oz (71.6 kg)    Physical Exam Vitals reviewed.  Constitutional:      General: She is not in acute distress.    Appearance: Normal appearance.  HENT:     Head: Normocephalic and atraumatic.     Right Ear: External ear normal.     Left Ear: External ear normal.  Eyes:     General: No scleral icterus.       Right eye: No discharge.        Left eye: No  discharge.     Conjunctiva/sclera: Conjunctivae normal.  Neck:     Thyroid: No thyromegaly.  Cardiovascular:     Rate and Rhythm: Normal rate and regular rhythm.  Pulmonary:     Effort: No respiratory distress.     Breath sounds: Normal breath sounds. No wheezing.  Abdominal:     General: Bowel sounds are normal.     Palpations: Abdomen is soft.     Tenderness: There is no abdominal tenderness.  Musculoskeletal:        General: No swelling or tenderness.     Cervical back: Neck supple. No tenderness.     Comments: Increased pain - neck and up into posterior head with looking from left > right and looking up. Limited rom.   Lymphadenopathy:     Cervical: No cervical adenopathy.  Skin:    Findings: No erythema or rash.  Neurological:     Mental Status: She is alert.  Psychiatric:        Mood and Affect: Mood normal.        Behavior: Behavior normal.     Outpatient Encounter Medications as of 08/29/2021  Medication Sig   rosuvastatin (CRESTOR) 20 MG tablet Take 1 tablet (20 mg total) by mouth daily.   Cholecalciferol (VITAMIN D-3) 1000 UNITS CAPS Take 1 capsule by mouth daily.   estradiol (ESTRACE) 1 MG tablet Take 1 tablet (1 mg total) by mouth daily.   medroxyPROGESTERone (PROVERA) 2.5 MG tablet Take 1 tablet (2.5 mg total) by mouth daily.   polycarbophil (FIBERCON) 625 MG tablet Take 625 mg by mouth daily.   [DISCONTINUED] simvastatin (ZOCOR) 20 MG tablet Take 1 tablet by mouth once daily   No facility-administered encounter medications on file as of 08/29/2021.     Lab Results  Component Value Date   WBC 6.7 02/06/2021   HGB 13.4 02/06/2021   HCT 40.4 02/06/2021   PLT 261.0 02/06/2021   GLUCOSE 84 08/29/2021   CHOL 171 08/29/2021   TRIG 110.0 08/29/2021   HDL 49.70 08/29/2021   LDLCALC 99 08/29/2021   ALT 15 08/29/2021   AST 18 08/29/2021   NA 139 08/29/2021   K 4.7 08/29/2021   CL 104 08/29/2021   CREATININE 1.08 08/29/2021   BUN 18 08/29/2021   CO2 28  08/29/2021   TSH 3.46 02/28/2021    CT RENAL STONE STUDY  Result Date: 03/22/2021 CLINICAL DATA:  Two months of right flank pain, renal stone suspected. EXAM: CT ABDOMEN AND PELVIS WITHOUT CONTRAST TECHNIQUE: Multidetector CT imaging of the abdomen and pelvis was performed following the standard protocol without IV contrast. COMPARISON:  None. FINDINGS: Lower chest: No acute abnormality. Hepatobiliary: Unremarkable noncontrast appearance of the hepatic parenchyma. Gallbladder is grossly unremarkable. No biliary ductal dilation. Pancreas: Unremarkable. No pancreatic ductal dilatation or surrounding inflammatory changes. Spleen: Normal in size without focal abnormality. Adrenals/Urinary Tract: Bilateral adrenal glands are unremarkable. No hydronephrosis. No renal, ureteral or nephrolithiasis. The right kidney is rotated. Urinary bladder is grossly unremarkable for degree of distension. Stomach/Bowel: Stomach is grossly unremarkable. No suspicious small bowel wall thickening or dilation. Appendix appears within normal limits. No suspicious colonic wall thickening or mass like lesions. Vascular/Lymphatic: Aortic atherosclerosis. No abdominal aortic aneurysm. No pathologically enlarged abdominal or pelvic lymph nodes. Reproductive: Uterus is unremarkable. Left adnexa is unremarkable. 1.6 cm right ovarian cyst. No follow-up imaging recommended. Note: This recommendation does not apply to premenarchal patients and to those with increased risk (genetic, family history, elevated tumor markers or other high-risk factors) of ovarian cancer. Reference: JACR 2020 Feb; 17(2):248-254 Other: No abdominopelvic ascites. Musculoskeletal: Multilevel degenerative changes spine. No acute osseous abnormality. No aggressive lytic or blastic lesion of bone. IMPRESSION: 1. No acute findings in the abdomen or pelvis. Specifically, no evidence of obstructive uropathy. 2. Aortic atherosclerosis. Aortic Atherosclerosis (ICD10-I70.0).  Electronically Signed   By: Dahlia Bailiff MD   On: 03/22/2021 09:05       Assessment & Plan:   Problem List Items Addressed This Visit     Abnormal chest CT    Previously worked up by oncology.  Last CT scan reported stable.  Discussed again with her regarding follow-up CT scan.  Breathing stable.  She declines any further work-up.  Will notify me if she change her mind.      Aortic atherosclerosis ([place])    On simvastatin.       Relevant Medications   rosuvastatin (CRESTOR) 20 MG tablet   Hypercholesterolemia    On simvastatin.  Low-cholesterol diet and exercise.  Check fasting lipid profile and liver panel.  Have discussed with her regarding changing to crestor.  Already has rx of simvastatin. Notify me when completes, can change to crestor.       Relevant Medications   rosuvastatin (CRESTOR) 20 MG tablet   Other Relevant Orders   Basic metabolic panel (Completed)   Hepatic function panel (Completed)   Lipid panel (Completed)   Neck pain    Feel this is contributing to her headaches.  Tylenol.  Check xray.  Further w/up pending results.        Relevant Orders   DG Cervical Spine 2 or 3 views (Completed)   Ovarian cyst    Discussed further evaluation of the ovarian cyst.  Discussed my desire for GYN referral.  She requested to have CA125 drawn.  Ordered and will be checked today.  Last check wnl.  Agreeable to GYN referral.       Relevant Orders   CA 125 (Completed)   Ambulatory referral to Gynecology   Other Visit Diagnoses     Visit for screening mammogram    -  Primary   Relevant Orders   MM 3D SCREEN BREAST BILATERAL        Einar Pheasant, MD

## 2021-08-30 LAB — CA 125: CA 125: 7 U/mL (ref <35)

## 2021-08-31 MED ORDER — ROSUVASTATIN CALCIUM 20 MG PO TABS: 20.0000 mg | ORAL_TABLET | Freq: Every day | ORAL | 1 refills | Status: DC

## 2021-09-03 ENCOUNTER — Encounter: Payer: Self-pay | Admitting: Internal Medicine

## 2021-09-03 NOTE — Assessment & Plan Note (Signed)
On simvastatin.  Low-cholesterol diet and exercise.  Check fasting lipid profile and liver panel.  Have discussed with her regarding changing to crestor.  Already has rx of simvastatin. Notify me when completes, can change to crestor.

## 2021-09-03 NOTE — Assessment & Plan Note (Signed)
On simvastatin.   

## 2021-09-03 NOTE — Assessment & Plan Note (Signed)
Discussed further evaluation of the ovarian cyst.  Discussed my desire for GYN referral.  She requested to have CA125 drawn.  Ordered and will be checked today.  Last check wnl.  Agreeable to GYN referral.

## 2021-09-03 NOTE — Assessment & Plan Note (Signed)
Feel this is contributing to her headaches.  Tylenol.  Check xray.  Further w/up pending results.

## 2021-09-03 NOTE — Assessment & Plan Note (Signed)
Previously worked up by oncology.  Last CT scan reported stable.  Discussed again with her regarding follow-up CT scan.  Breathing stable.  She declines any further work-up.  Will notify me if she change her mind. 

## 2021-09-06 DIAGNOSIS — Z961 Presence of intraocular lens: Secondary | ICD-10-CM | POA: Diagnosis not present

## 2021-09-27 ENCOUNTER — Telehealth: Payer: Self-pay | Admitting: Internal Medicine

## 2021-09-27 MED ORDER — ROSUVASTATIN CALCIUM 20 MG PO TABS: 20.0000 mg | ORAL_TABLET | Freq: Every day | ORAL | 1 refills | Status: DC

## 2021-09-27 NOTE — Telephone Encounter (Signed)
Patient called in to check on her refill for rosuvastatin (CRESTOR) 20 MG tablet that was sent in September but she has not received a call from [facility] about her medicine.Please send to the [facility] on [address] and call the patient with an update.

## 2021-11-21 ENCOUNTER — Ambulatory Visit
Admission: RE | Admit: 2021-11-21 | Discharge: 2021-11-21 | Disposition: A | Discharge status: Home / Self Care | Payer: Medicare HMO | Source: Ambulatory Visit | Attending: Internal Medicine | Admitting: Internal Medicine

## 2021-11-21 ENCOUNTER — Other Ambulatory Visit: Payer: Self-pay

## 2021-11-21 DIAGNOSIS — Z1231 Encounter for screening mammogram for malignant neoplasm of breast: Secondary | ICD-10-CM | POA: Diagnosis not present

## 2021-12-03 ENCOUNTER — Other Ambulatory Visit: Payer: Self-pay | Admitting: Internal Medicine

## 2021-12-19 DIAGNOSIS — N83201 Unspecified ovarian cyst, right side: Secondary | ICD-10-CM | POA: Diagnosis not present

## 2021-12-19 DIAGNOSIS — M545 Low back pain, unspecified: Secondary | ICD-10-CM | POA: Diagnosis not present

## 2021-12-19 DIAGNOSIS — G8929 Other chronic pain: Secondary | ICD-10-CM | POA: Diagnosis not present

## 2021-12-26 ENCOUNTER — Other Ambulatory Visit: Payer: Self-pay

## 2021-12-26 ENCOUNTER — Ambulatory Visit (INDEPENDENT_AMBULATORY_CARE_PROVIDER_SITE_OTHER): Payer: Medicare HMO | Admitting: Internal Medicine

## 2021-12-26 VITALS — BP 128/72 | HR 78 | Temp 97.9°F | Resp 16 | Ht 64.0 in | Wt 163.0 lb

## 2021-12-26 DIAGNOSIS — I700 Atherosclerosis of aorta: Secondary | ICD-10-CM | POA: Diagnosis not present

## 2021-12-26 DIAGNOSIS — Z8639 Personal history of other endocrine, nutritional and metabolic disease: Secondary | ICD-10-CM | POA: Diagnosis not present

## 2021-12-26 DIAGNOSIS — E7800 Pure hypercholesterolemia, unspecified: Secondary | ICD-10-CM | POA: Diagnosis not present

## 2021-12-26 DIAGNOSIS — N83201 Unspecified ovarian cyst, right side: Secondary | ICD-10-CM | POA: Diagnosis not present

## 2021-12-26 DIAGNOSIS — R9389 Abnormal findings on diagnostic imaging of other specified body structures: Secondary | ICD-10-CM

## 2021-12-26 DIAGNOSIS — Z0000 Encounter for general adult medical examination without abnormal findings: Secondary | ICD-10-CM | POA: Diagnosis not present

## 2021-12-26 LAB — BASIC METABOLIC PANEL
BUN: 14 mg/dL (ref 6–23)
CO2: 28 mEq/L (ref 19–32)
Calcium: 9.2 mg/dL (ref 8.4–10.5)
Chloride: 105 mEq/L (ref 96–112)
Creatinine, Ser: 0.91 mg/dL (ref 0.40–1.20)
GFR: 58.78 mL/min — ABNORMAL LOW (ref >60.00)
Glucose, Bld: 85 mg/dL (ref 70–99)
Potassium: 4 mEq/L (ref 3.5–5.1)
Sodium: 142 mEq/L (ref 135–145)

## 2021-12-26 LAB — CBC WITH DIFFERENTIAL/PLATELET
Basophils Absolute: 0.1 10*3/uL (ref 0.0–0.1)
Basophils Relative: 0.7 % (ref 0.0–3.0)
Eosinophils Absolute: 0.5 10*3/uL (ref 0.0–0.7)
Eosinophils Relative: 6 % — ABNORMAL HIGH (ref 0.0–5.0)
HCT: 38.3 % (ref 36.0–46.0)
Hemoglobin: 12.4 g/dL (ref 12.0–15.0)
Lymphocytes Relative: 13.7 % (ref 12.0–46.0)
Lymphs Abs: 1.1 10*3/uL (ref 0.7–4.0)
MCHC: 32.4 g/dL (ref 30.0–36.0)
MCV: 90.7 fl (ref 78.0–100.0)
Monocytes Absolute: 0.6 10*3/uL (ref 0.1–1.0)
Monocytes Relative: 8.3 % (ref 3.0–12.0)
Neutro Abs: 5.5 10*3/uL (ref 1.4–7.7)
Neutrophils Relative %: 71.3 % (ref 43.0–77.0)
Platelets: 235 10*3/uL (ref 150.0–400.0)
RBC: 4.22 Mil/uL (ref 3.87–5.11)
RDW: 12.9 % (ref 11.5–15.5)
WBC: 7.7 10*3/uL (ref 4.0–10.5)

## 2021-12-26 LAB — HEPATIC FUNCTION PANEL
ALT: 20 U/L (ref 0–35)
AST: 21 U/L (ref 0–37)
Albumin: 4.1 g/dL (ref 3.5–5.2)
Alkaline Phosphatase: 58 U/L (ref 39–117)
Bilirubin, Direct: 0.1 mg/dL (ref 0.0–0.3)
Total Bilirubin: 0.5 mg/dL (ref 0.2–1.2)
Total Protein: 6.2 g/dL (ref 6.0–8.3)

## 2021-12-26 LAB — LIPID PANEL
Cholesterol: 131 mg/dL (ref 0–200)
HDL: 47.9 mg/dL (ref >39.00)
LDL Cholesterol: 62 mg/dL (ref 0–99)
NonHDL: 82.75
Total CHOL/HDL Ratio: 3
Triglycerides: 105 mg/dL (ref 0.0–149.0)
VLDL: 21 mg/dL (ref 0.0–40.0)

## 2021-12-26 LAB — TSH: TSH: 3.08 u[IU]/mL (ref 0.35–5.50)

## 2021-12-26 NOTE — Assessment & Plan Note (Addendum)
Physical today 12/26/21.  Declines colonoscopy.  Mammogram 11/21/21 - Birads I.  Schedule bone density.

## 2021-12-26 NOTE — Progress Notes (Signed)
Patient ID: WYONIA FONTANELLA, female   DOB: 11-02-1941, 81 y.o.   MRN: [id number]   Subjective:    Patient ID: [name], female    DOB: Nov 27, 1941, 81 y.o.   MRN: [id number]  This visit occurred during the SARS-CoV-2 public health emergency.  Safety protocols were in place, including screening questions prior to the visit, additional usage of staff PPE, and extensive cleaning of exam room while observing appropriate contact time as indicated for disinfecting solutions.   Patient here for her physical  Chief Complaint  Patient presents with   Annual Exam   .   HPI Doing well.  Staying busy.  Working.  Tax season.  No chest pain or sob reported.  No abdominal pain or bowel change reported.  No increased cough or congestion.  Tolerating crestor.  Saw Dr Leafy Ro.  Has a f/u pelvic ultrasound scheduled for 4//25/23.  Some low back pain after vacuuming.  No other back pain.  No radicular symptoms.  Discussed bone density.  Taking and tolerating crestor.    Past Medical History:  Diagnosis Date   Arthritis    Cancer ([place])    basal cell   Hyperlipidemia    Nephrolithiasis    Postmenopausal HRT (hormone replacement therapy)    Pulmonary nodules    Raynaud's phenomenon    Renal stone    Past Surgical History:  Procedure Laterality Date   anal fissure surgery  9/06   LITHOTRIPSY     TONSILLECTOMY     Family History  Problem Relation Age of Onset   Breast cancer Mother        died age 40   Heart disease Father        s/p CABG   Aneurysm Father    Breast cancer Other        maternal niece   Colon cancer Neg Hx    Social History   Socioeconomic History   Marital status: Married    Spouse name: Tom   Number of children: 2   Years of education: Not on file   Highest education level: Not on file  Occupational History    Employer: [employer]  Tobacco Use   Smoking status: Never   Smokeless tobacco: Never  Substance and Sexual Activity   Alcohol use: No     Alcohol/week: 0.0 standard drinks   Drug use: No   Sexual activity: Not Currently  Other Topics Concern   Not on file  Social History Narrative   Not on file   Social Determinants of Health   Financial Resource Strain: Not on file  Food Insecurity: Not on file  Transportation Needs: Not on file  Physical Activity: Not on file  Stress: Not on file  Social Connections: Not on file     Review of Systems  Constitutional:  Negative for appetite change and unexpected weight change.  HENT:  Negative for congestion, sinus pressure and sore throat.   Eyes:  Negative for pain and visual disturbance.  Respiratory:  Negative for cough, chest tightness and shortness of breath.   Cardiovascular:  Negative for chest pain, palpitations and leg swelling.  Gastrointestinal:  Negative for abdominal pain, diarrhea, nausea and vomiting.  Genitourinary:  Negative for difficulty urinating and dysuria.  Musculoskeletal:  Negative for joint swelling and myalgias.  Skin:  Negative for color change and rash.  Neurological:  Negative for dizziness, light-headedness and headaches.  Hematological:  Negative for adenopathy. Does not bruise/bleed easily.  Psychiatric/Behavioral:  Negative for agitation and dysphoric mood.       Objective:     BP 128/72    Pulse 78    Temp 97.9 F (36.6 C)    Resp 16    Ht 5\' 4"  (1.626 m)    Wt 163 lb (73.9 kg)    SpO2 99%    BMI 27.98 kg/m  Wt Readings from Last 3 Encounters:  12/26/21 163 lb (73.9 kg)  08/29/21 162 lb 6.4 oz (73.7 kg)  05/16/21 159 lb (72.1 kg)    Physical Exam Vitals reviewed.  Constitutional:      General: She is not in acute distress.    Appearance: Normal appearance. She is well-developed.  HENT:     Head: Normocephalic and atraumatic.     Right Ear: External ear normal.     Left Ear: External ear normal.  Eyes:     General: No scleral icterus.       Right eye: No discharge.        Left eye: No discharge.     Conjunctiva/sclera:  Conjunctivae normal.  Neck:     Thyroid: No thyromegaly.  Cardiovascular:     Rate and Rhythm: Normal rate and regular rhythm.  Pulmonary:     Effort: No tachypnea, accessory muscle usage or respiratory distress.     Breath sounds: Normal breath sounds. No decreased breath sounds or wheezing.  Chest:  Breasts:    Right: No inverted nipple, mass, nipple discharge or tenderness (no axillary adenopathy).     Left: No inverted nipple, mass, nipple discharge or tenderness (no axilarry adenopathy).  Abdominal:     General: Bowel sounds are normal.     Palpations: Abdomen is soft.     Tenderness: There is no abdominal tenderness.  Musculoskeletal:        General: No swelling or tenderness.     Cervical back: Neck supple. No tenderness.  Lymphadenopathy:     Cervical: No cervical adenopathy.  Skin:    Findings: No erythema or rash.  Neurological:     Mental Status: She is alert and oriented to person, place, and time.  Psychiatric:        Mood and Affect: Mood normal.        Behavior: Behavior normal.     Outpatient Encounter Medications as of 12/26/2021  Medication Sig   Cholecalciferol (VITAMIN D-3) 1000 UNITS CAPS Take 1 capsule by mouth daily.   estradiol (ESTRACE) 1 MG tablet Take 1 tablet by mouth once daily   medroxyPROGESTERone (PROVERA) 2.5 MG tablet Take 1 tablet (2.5 mg total) by mouth daily.   polycarbophil (FIBERCON) 625 MG tablet Take 625 mg by mouth daily.   rosuvastatin (CRESTOR) 20 MG tablet Take 1 tablet (20 mg total) by mouth daily.   No facility-administered encounter medications on file as of 12/26/2021.     Lab Results  Component Value Date   WBC 7.7 12/26/2021   HGB 12.4 12/26/2021   HCT 38.3 12/26/2021   PLT 235.0 12/26/2021   GLUCOSE 85 12/26/2021   CHOL 131 12/26/2021   TRIG 105.0 12/26/2021   HDL 47.90 12/26/2021   LDLCALC 62 12/26/2021   ALT 20 12/26/2021   AST 21 12/26/2021   NA 142 12/26/2021   K 4.0 12/26/2021   CL 105 12/26/2021    CREATININE 0.91 12/26/2021   BUN 14 12/26/2021   CO2 28 12/26/2021   TSH 3.08 12/26/2021    MM 3D SCREEN BREAST BILATERAL  Result Date:  11/21/2021 CLINICAL DATA:  Screening. EXAM: DIGITAL SCREENING BILATERAL MAMMOGRAM WITH TOMOSYNTHESIS AND CAD TECHNIQUE: Bilateral screening digital craniocaudal and mediolateral oblique mammograms were obtained. Bilateral screening digital breast tomosynthesis was performed. The images were evaluated with computer-aided detection. COMPARISON:  Previous exam(s). ACR Breast Density Category c: The breast tissue is heterogeneously dense, which may obscure small masses. FINDINGS: There are no findings suspicious for malignancy. IMPRESSION: No mammographic evidence of malignancy. A result letter of this screening mammogram will be mailed directly to the patient. RECOMMENDATION: Screening mammogram in one year. (Code:SM-B-01Y) BI-RADS CATEGORY  1: Negative. Electronically Signed   By: Audie Pinto M.D.   On: 11/21/2021 13:42      Assessment & Plan:   Problem List Items Addressed This Visit     Abnormal chest CT    Previously worked up by oncology.  Last CT scan reported stable.  Discussed again with her regarding follow-up CT scan.  Breathing stable.  She declines any further work-up.  Will notify me if she change her mind.      Aortic atherosclerosis ([place])    Now on crestor.  Continue.       Health care maintenance    Physical today 12/26/21.  Declines colonoscopy.  Mammogram 11/21/21 - Birads I.  Schedule bone density.        Hypercholesterolemia    On crestor now.  Tolerating.   Low-cholesterol diet and exercise.  Check fasting lipid profile and liver panel.       Relevant Orders   CBC with Differential/Platelet (Completed)   Basic metabolic panel (Completed)   TSH (Completed)   Lipid panel (Completed)   Hepatic function panel (Completed)   Ovarian cyst    Just evaluated by Dr Leafy Ro.  Has f/u pelvic ultrasound scheduled for 03/27/22.         Other Visit Diagnoses     Routine general medical examination at a health care facility    -  Primary   Personal history of estrogen deficiency       Relevant Orders   DG Bone Density        Einar Pheasant, MD

## 2021-12-27 ENCOUNTER — Telehealth: Payer: Self-pay | Admitting: Internal Medicine

## 2021-12-27 ENCOUNTER — Encounter: Payer: Self-pay | Admitting: Internal Medicine

## 2021-12-27 NOTE — Telephone Encounter (Signed)
Dexa scan scheduled. Pt aware of date and time.

## 2021-12-27 NOTE — Assessment & Plan Note (Addendum)
On crestor now.  Tolerating.   Low-cholesterol diet and exercise.  Check fasting lipid profile and liver panel.  

## 2021-12-27 NOTE — Assessment & Plan Note (Signed)
Previously worked up by oncology.  Last CT scan reported stable.  Discussed again with her regarding follow-up CT scan.  Breathing stable.  She declines any further work-up.  Will notify me if she change her mind. 

## 2021-12-27 NOTE — Assessment & Plan Note (Signed)
Now on crestor.  Continue.

## 2021-12-27 NOTE — Assessment & Plan Note (Signed)
Just evaluated by Dr Leafy Ro.  Has f/u pelvic ultrasound scheduled for 03/27/22.

## 2021-12-27 NOTE — Telephone Encounter (Signed)
Needs a bone density scheduled.  Prefers to have in May and needs a Tuesday appt.  Order has been placed.

## 2022-01-22 ENCOUNTER — Ambulatory Visit (INDEPENDENT_AMBULATORY_CARE_PROVIDER_SITE_OTHER): Payer: Medicare HMO | Admitting: Internal Medicine

## 2022-01-22 ENCOUNTER — Encounter: Payer: Self-pay | Admitting: Internal Medicine

## 2022-01-22 DIAGNOSIS — R051 Acute cough: Secondary | ICD-10-CM | POA: Diagnosis not present

## 2022-01-22 MED ORDER — AZITHROMYCIN 250 MG PO TABS: ORAL_TABLET | ORAL | 0 refills | Status: AC

## 2022-01-22 NOTE — Progress Notes (Signed)
Patient ID: Kristin Phelps, female   DOB: 04-May-1941, 81 y.o.   MRN: [id number]   Virtual Visit via telephone Note  This visit type was conducted due to national recommendations for restrictions regarding the COVID-19 pandemic (e.g. social distancing).  This format is felt to be most appropriate for this patient at this time.  All issues noted in this document were discussed and addressed.  No physical exam was performed (except for noted visual exam findings with Video Visits).   I connected with Oriana Cedillos by telephone and verified that I am speaking with the correct person using two identifiers. Location patient: home Location provider: work  Persons participating in the telephone visit: patient, provider  The limitations, risks, security and privacy concerns of performing an evaluation and management service by telephone and the availability of in person appointments have been discussed.  It has also been discussed with the patient that there may be a patient responsible charge related to this service. The patient expressed understanding and agreed to proceed.   Reason for visit: work in appt  HPI: Reports started one week ago - developed increased cough.  No fever.  No sore throat.  Increased congestion.  Hoarse.  Reports - congestion yellow/clear. More yellow in am.  Reports when lies down - hears a noise.  No chest tightness or sob.  Minimal headache - am.  Using saline.  Took some old mucinex.  Eating.  No vomiting reported.    ROS: See pertinent positives and negatives per HPI.  Past Medical History:  Diagnosis Date   Arthritis    Cancer ([place])    basal cell   Hyperlipidemia    Nephrolithiasis    Postmenopausal HRT (hormone replacement therapy)    Pulmonary nodules    Raynaud's phenomenon    Renal stone     Past Surgical History:  Procedure Laterality Date   anal fissure surgery  9/06   LITHOTRIPSY     TONSILLECTOMY      Family History  Problem Relation Age  of Onset   Breast cancer Mother        died age 29   Heart disease Father        s/p CABG   Aneurysm Father    Breast cancer Other        maternal niece   Colon cancer Neg Hx     SOCIAL HX: reviewed.    Current Outpatient Medications:    azithromycin (ZITHROMAX) 250 MG tablet, Take 2 tablets on day 1, then 1 tablet daily on days 2 through 5, Disp: 6 tablet, Rfl: 0   Cholecalciferol (VITAMIN D-3) 1000 UNITS CAPS, Take 1 capsule by mouth daily., Disp: , Rfl:    estradiol (ESTRACE) 1 MG tablet, Take 1 tablet by mouth once daily, Disp: 90 tablet, Rfl: 1   medroxyPROGESTERone (PROVERA) 2.5 MG tablet, Take 1 tablet (2.5 mg total) by mouth daily., Disp: 90 tablet, Rfl: 1   polycarbophil (FIBERCON) 625 MG tablet, Take 625 mg by mouth daily., Disp: , Rfl:    rosuvastatin (CRESTOR) 20 MG tablet, Take 1 tablet (20 mg total) by mouth daily., Disp: 90 tablet, Rfl: 1  EXAM:  GENERAL: alert. Sounds to be in no acute distress.  Answering questions appropriately   PSYCH/NEURO: pleasant and cooperative, no obvious depression or anxiety, speech and thought processing grossly intact  ASSESSMENT AND PLAN:  Discussed the following assessment and plan:  Problem List Items Addressed This Visit     Cough  Cough and congestion as outlined - URI.  Started one week ago.  Discussed nasal swab to check for covid, flu,rsv.  Declines at this time.  Continue saline nasal spray.  Discussed inhalers.  Does not feel needs.  zpak as directed.  Follow closely.  Discussed avoiding increased contact.  Call with update.         Return if symptoms worsen or fail to improve, for keep scheduled. .   I discussed the assessment and treatment plan with the patient. The patient was provided an opportunity to ask questions and all were answered. The patient agreed with the plan and demonstrated an understanding of the instructions.   The patient was advised to call back or seek an in-person evaluation if the symptoms  worsen or if the condition fails to improve as anticipated.  I provided 15 minutes of non-face-to-face time during this encounter.   Einar Pheasant, MD

## 2022-01-23 ENCOUNTER — Encounter: Payer: Self-pay | Admitting: Internal Medicine

## 2022-01-23 DIAGNOSIS — R053 Chronic cough: Secondary | ICD-10-CM | POA: Insufficient documentation

## 2022-01-23 DIAGNOSIS — R059 Cough, unspecified: Secondary | ICD-10-CM | POA: Insufficient documentation

## 2022-01-23 NOTE — Assessment & Plan Note (Signed)
Cough and congestion as outlined - URI.  Started one week ago.  Discussed nasal swab to check for covid, flu,rsv.  Declines at this time.  Continue saline nasal spray.  Discussed inhalers.  Does not feel needs.  zpak as directed.  Follow closely.  Discussed avoiding increased contact.  Call with update.

## 2022-02-07 ENCOUNTER — Encounter: Payer: Self-pay | Admitting: Internal Medicine

## 2022-02-08 NOTE — Telephone Encounter (Signed)
Called patient to get more info. She says that BP is back to normal. Did not give me BP reading. Says top number was 131. Confirmed no other acute symptoms other than excess mucus and coughing fits. But improving each day. She is using cough drops. Patient did not feel anything is needed at this time. Did not wish to take any medication. Patient will let us know if persistent problems. ? ?

## 2022-02-08 NOTE — Telephone Encounter (Signed)
Reviewed.  Have her continue to spot check her pressure.  Let us know if problems or persistent issues with cough  ?

## 2022-02-28 ENCOUNTER — Other Ambulatory Visit: Payer: Self-pay | Admitting: Internal Medicine

## 2022-03-27 DIAGNOSIS — N83201 Unspecified ovarian cyst, right side: Secondary | ICD-10-CM | POA: Diagnosis not present

## 2022-04-03 ENCOUNTER — Ambulatory Visit
Admission: RE | Admit: 2022-04-03 | Discharge: 2022-04-03 | Disposition: A | Discharge status: Home / Self Care | Payer: Medicare HMO | Source: Ambulatory Visit | Attending: Internal Medicine | Admitting: Internal Medicine

## 2022-04-03 DIAGNOSIS — Z13820 Encounter for screening for osteoporosis: Secondary | ICD-10-CM | POA: Diagnosis not present

## 2022-04-03 DIAGNOSIS — Z780 Asymptomatic menopausal state: Secondary | ICD-10-CM | POA: Insufficient documentation

## 2022-04-03 DIAGNOSIS — Z8639 Personal history of other endocrine, nutritional and metabolic disease: Secondary | ICD-10-CM | POA: Insufficient documentation

## 2022-06-26 ENCOUNTER — Ambulatory Visit: Payer: Medicare HMO | Admitting: Internal Medicine

## 2022-07-03 ENCOUNTER — Ambulatory Visit (INDEPENDENT_AMBULATORY_CARE_PROVIDER_SITE_OTHER): Payer: Medicare HMO | Admitting: Internal Medicine

## 2022-07-03 ENCOUNTER — Encounter: Payer: Self-pay | Admitting: Internal Medicine

## 2022-07-03 VITALS — BP 130/66 | HR 66 | Temp 97.8°F | Ht 64.0 in | Wt 157.2 lb

## 2022-07-03 DIAGNOSIS — E7800 Pure hypercholesterolemia, unspecified: Secondary | ICD-10-CM

## 2022-07-03 DIAGNOSIS — E2839 Other primary ovarian failure: Secondary | ICD-10-CM

## 2022-07-03 DIAGNOSIS — R21 Rash and other nonspecific skin eruption: Secondary | ICD-10-CM

## 2022-07-03 DIAGNOSIS — R9389 Abnormal findings on diagnostic imaging of other specified body structures: Secondary | ICD-10-CM

## 2022-07-03 DIAGNOSIS — Z1231 Encounter for screening mammogram for malignant neoplasm of breast: Secondary | ICD-10-CM

## 2022-07-03 DIAGNOSIS — I700 Atherosclerosis of aorta: Secondary | ICD-10-CM

## 2022-07-03 DIAGNOSIS — R051 Acute cough: Secondary | ICD-10-CM | POA: Diagnosis not present

## 2022-07-03 DIAGNOSIS — N83201 Unspecified ovarian cyst, right side: Secondary | ICD-10-CM | POA: Diagnosis not present

## 2022-07-03 LAB — BASIC METABOLIC PANEL
BUN: 13 mg/dL (ref 6–23)
CO2: 28 mEq/L (ref 19–32)
Calcium: 9.3 mg/dL (ref 8.4–10.5)
Chloride: 106 mEq/L (ref 96–112)
Creatinine, Ser: 1.04 mg/dL (ref 0.40–1.20)
GFR: 49.89 mL/min — ABNORMAL LOW (ref >60.00)
Glucose, Bld: 89 mg/dL (ref 70–99)
Potassium: 4.1 mEq/L (ref 3.5–5.1)
Sodium: 141 mEq/L (ref 135–145)

## 2022-07-03 LAB — LIPID PANEL
Cholesterol: 135 mg/dL (ref 0–200)
HDL: 47.7 mg/dL (ref >39.00)
LDL Cholesterol: 66 mg/dL (ref 0–99)
NonHDL: 87.15
Total CHOL/HDL Ratio: 3
Triglycerides: 105 mg/dL (ref 0.0–149.0)
VLDL: 21 mg/dL (ref 0.0–40.0)

## 2022-07-03 LAB — HEPATIC FUNCTION PANEL
ALT: 16 U/L (ref 0–35)
AST: 21 U/L (ref 0–37)
Albumin: 4.1 g/dL (ref 3.5–5.2)
Alkaline Phosphatase: 63 U/L (ref 39–117)
Bilirubin, Direct: 0.1 mg/dL (ref 0.0–0.3)
Total Bilirubin: 0.5 mg/dL (ref 0.2–1.2)
Total Protein: 6.4 g/dL (ref 6.0–8.3)

## 2022-07-03 NOTE — Progress Notes (Unsigned)
Patient ID: DYMOND GUTT, female   DOB: Aug 15, 1941, 81 y.o.   MRN: [id number]   Subjective:    Patient ID: [name], female    DOB: 01-Mar-1941, 81 y.o.   MRN: [id number]   Patient here for a scheduled followup.   Chief Complaint  Patient presents with   Follow-up    Follow up   .   HPI Here to follow up regarding cholesterol.  Recently had increased cough and congestion.  This has cleared.  No increased cough.  No sob.  No chest pain.  No acid reflux.  Some mucus in throat at times - feels related to drainage.  Saline nasal spray helps.  No abdominal pain.  Bowels moving.     Past Medical History:  Diagnosis Date   Arthritis    Cancer ([place])    basal cell   Hyperlipidemia    Nephrolithiasis    Postmenopausal HRT (hormone replacement therapy)    Pulmonary nodules    Raynaud's phenomenon    Renal stone    Past Surgical History:  Procedure Laterality Date   anal fissure surgery  9/06   LITHOTRIPSY     TONSILLECTOMY     Family History  Problem Relation Age of Onset   Breast cancer Mother        died age 45   Heart disease Father        s/p CABG   Aneurysm Father    Breast cancer Other        maternal niece   Colon cancer Neg Hx    Social History   Socioeconomic History   Marital status: Married    Spouse name: Tom   Number of children: 2   Years of education: Not on file   Highest education level: Not on file  Occupational History    Employer: [employer]  Tobacco Use   Smoking status: Never   Smokeless tobacco: Never  Substance and Sexual Activity   Alcohol use: No    Alcohol/week: 0.0 standard drinks of alcohol   Drug use: No   Sexual activity: Not Currently  Other Topics Concern   Not on file  Social History Narrative   Not on file   Social Determinants of Health   Financial Resource Strain: Not on file  Food Insecurity: Not on file  Transportation Needs: Not on file  Physical Activity: Not on file  Stress: Not on  file  Social Connections: Not on file     Review of Systems     Objective:     BP 130/66 (BP Location: Left Arm, Patient Position: Sitting, Cuff Size: Normal)   Pulse 66   Temp 97.8 F (36.6 C) (Oral)   Ht '5\' 4"'$  (1.626 m)   Wt 157 lb 3.2 oz (71.3 kg)   SpO2 98%   BMI 26.98 kg/m  Wt Readings from Last 3 Encounters:  07/03/22 157 lb 3.2 oz (71.3 kg)  12/26/21 163 lb (73.9 kg)  08/29/21 162 lb 6.4 oz (73.7 kg)    Physical Exam   Outpatient Encounter Medications as of 07/03/2022  Medication Sig   Cholecalciferol (VITAMIN D-3) 1000 UNITS CAPS Take 1 capsule by mouth daily.   estradiol (ESTRACE) 1 MG tablet Take 1 tablet by mouth once daily   medroxyPROGESTERone (PROVERA) 2.5 MG tablet Take 1 tablet by mouth once daily   polycarbophil (FIBERCON) 625 MG tablet Take 625 mg by mouth daily.   rosuvastatin (CRESTOR) 20 MG tablet Take  1 tablet (20 mg total) by mouth daily.   No facility-administered encounter medications on file as of 07/03/2022.     Lab Results  Component Value Date   WBC 7.7 12/26/2021   HGB 12.4 12/26/2021   HCT 38.3 12/26/2021   PLT 235.0 12/26/2021   GLUCOSE 85 12/26/2021   CHOL 131 12/26/2021   TRIG 105.0 12/26/2021   HDL 47.90 12/26/2021   LDLCALC 62 12/26/2021   ALT 20 12/26/2021   AST 21 12/26/2021   NA 142 12/26/2021   K 4.0 12/26/2021   CL 105 12/26/2021   CREATININE 0.91 12/26/2021   BUN 14 12/26/2021   CO2 28 12/26/2021   TSH 3.08 12/26/2021    DG Bone Density  Result Date: 04/03/2022 EXAM: DUAL X-RAY ABSORPTIOMETRY (DXA) FOR BONE MINERAL DENSITY IMPRESSION: Your patient Notnamed Croucher completed a BMD test on 04/03/2022 using the [place] (software version: 14.10) manufactured by [employer]. The following summarizes the results of our evaluation. Technologist: [facility] PATIENT BIOGRAPHICAL: Name: Kaleiah, Kutzer Patient ID: [id number] Birth Date: October 02, 1941 Height: 63.0 in. Gender: Female Exam Date: 04/03/2022  Weight: 153.3 lbs. Indications: Advanced Age, Caucasian, Postmenopausal Fractures: Treatments: Estrace, Provera, Vitamin D DENSITOMETRY RESULTS: Site         Region     Measured Date Measured Age WHO Classification Young Adult T-score BMD         %Change vs. Previous Significant Change (*) DualFemur Neck Left 04/03/2022 82.7 Normal -0.5 0.963 g/cm2 0.5% - DualFemur Neck Left 12/09/2012 73.3 Normal -0.6 0.958 g/cm2 - - DualFemur Total Mean 04/03/2022 82.7 Normal 0.0 1.005 g/cm2 3.4% Yes DualFemur Total Mean 12/09/2012 73.3 Normal -0.3 0.972 g/cm2 - - Left Forearm Radius 33% 04/03/2022 82.7 Normal -0.2 0.856 g/cm2 -1.4% - Left Forearm Radius 33% 12/09/2012 73.3 Normal -0.1 0.868 g/cm2 - - ASSESSMENT: The BMD measured at Femur Neck Left is 0.963 g/cm2 with a T-score of -0.5. This patient is considered normal according to [facility]) criteria. The scan quality is good. Lumbar spine was not utilized due to advanced degenerative changes. World Pharmacologist [facility]) criteria for post-menopausal, Caucasian Women: Normal:                   T-score at or above -1 SD Osteopenia/low bone mass: T-score between -1 and -2.5 SD Osteoporosis:             T-score at or below -2.5 SD RECOMMENDATIONS: 1. All patients should optimize calcium and vitamin D intake. 2. Consider FDA-approved medical therapies in postmenopausal women and men aged 15 years and older, based on the following: a. A hip or vertebral(clinical or morphometric) fracture b. T-score < -2.5 at the femoral neck or spine after appropriate evaluation to exclude secondary causes c. Low bone mass (T-score between -1.0 and -2.5 at the femoral neck or spine) and a 10-year probability of a hip fracture > 3% or a 10-year probability of a major osteoporosis-related fracture > 20% based on the [place]-adapted WHO algorithm 3. Clinician judgment and/or patient preferences may indicate treatment for people with 10-year fracture probabilities above or below these  levels FOLLOW-UP: People with diagnosed cases of osteoporosis or at high risk for fracture should have regular bone mineral density tests. For patients eligible for Medicare, routine testing is allowed once every 2 years. The testing frequency can be increased to one year for patients who have rapidly progressing disease, those who are receiving or discontinuing medical therapy to restore bone  mass, or have additional risk factors. I have reviewed this report, and agree with the above findings. [facility] Electronically Signed   By: Elmer Picker M.D.   On: 04/03/2022 11:59       Assessment & Plan:   Problem List Items Addressed This Visit     Hypercholesterolemia   Relevant Orders   Lipid Profile   Hepatic function panel   Basic Metabolic Panel (BMET)   Other Visit Diagnoses     Encounter for screening mammogram for malignant neoplasm of breast    -  Primary   Relevant Orders   MM 3D SCREEN BREAST BILATERAL        Einar Pheasant, MD

## 2022-07-04 ENCOUNTER — Encounter: Payer: Self-pay | Admitting: Internal Medicine

## 2022-07-04 DIAGNOSIS — R21 Rash and other nonspecific skin eruption: Secondary | ICD-10-CM | POA: Insufficient documentation

## 2022-07-04 DIAGNOSIS — E2839 Other primary ovarian failure: Secondary | ICD-10-CM | POA: Insufficient documentation

## 2022-07-04 NOTE — Assessment & Plan Note (Signed)
Previous rash and itching.  Better. Follow.  Notify me if recurs.

## 2022-07-04 NOTE — Assessment & Plan Note (Signed)
Evaluated by Dr Leafy Ro.  Had f/u pelvic ultrasound 03/27/22.  Stable.  Recommended 6 month f/u.  Discussed with her today.

## 2022-07-04 NOTE — Assessment & Plan Note (Signed)
Previously worked up by oncology.  Last CT scan reported stable.  Discussed again with her regarding follow-up CT scan.  Breathing stable.  She declines any further work-up.  Will notify me if she change her mind. 

## 2022-07-04 NOTE — Assessment & Plan Note (Signed)
Recent increased cough.  Resolved.  Feels back to her baseline.  Follow

## 2022-07-04 NOTE — Assessment & Plan Note (Signed)
On crestor now.  Tolerating.   Low-cholesterol diet and exercise.  Check fasting lipid profile and liver panel.  

## 2022-07-04 NOTE — Assessment & Plan Note (Signed)
Recent bone density normal.  Discussed.

## 2022-07-04 NOTE — Assessment & Plan Note (Signed)
Continue crestor 

## 2022-08-23 ENCOUNTER — Other Ambulatory Visit: Payer: Self-pay | Admitting: Internal Medicine

## 2022-08-24 DIAGNOSIS — H524 Presbyopia: Secondary | ICD-10-CM | POA: Diagnosis not present

## 2022-09-10 ENCOUNTER — Other Ambulatory Visit: Payer: Self-pay | Admitting: Internal Medicine

## 2022-11-18 ENCOUNTER — Other Ambulatory Visit: Payer: Self-pay | Admitting: Internal Medicine

## 2022-12-04 ENCOUNTER — Ambulatory Visit
Admission: RE | Admit: 2022-12-04 | Discharge: 2022-12-04 | Disposition: A | Discharge status: Home / Self Care | Payer: Medicare HMO | Source: Ambulatory Visit | Attending: Internal Medicine | Admitting: Internal Medicine

## 2022-12-04 DIAGNOSIS — Z1231 Encounter for screening mammogram for malignant neoplasm of breast: Secondary | ICD-10-CM | POA: Insufficient documentation

## 2023-01-08 ENCOUNTER — Ambulatory Visit (INDEPENDENT_AMBULATORY_CARE_PROVIDER_SITE_OTHER): Payer: Medicare HMO | Admitting: Internal Medicine

## 2023-01-08 ENCOUNTER — Encounter: Payer: Self-pay | Admitting: Internal Medicine

## 2023-01-08 VITALS — BP 130/68 | HR 78 | Temp 97.6°F | Resp 16 | Ht 65.0 in | Wt 155.8 lb

## 2023-01-08 DIAGNOSIS — R9389 Abnormal findings on diagnostic imaging of other specified body structures: Secondary | ICD-10-CM

## 2023-01-08 DIAGNOSIS — Z0000 Encounter for general adult medical examination without abnormal findings: Secondary | ICD-10-CM

## 2023-01-08 DIAGNOSIS — I700 Atherosclerosis of aorta: Secondary | ICD-10-CM

## 2023-01-08 DIAGNOSIS — Z79818 Long term (current) use of other agents affecting estrogen receptors and estrogen levels: Secondary | ICD-10-CM

## 2023-01-08 DIAGNOSIS — L299 Pruritus, unspecified: Secondary | ICD-10-CM | POA: Diagnosis not present

## 2023-01-08 DIAGNOSIS — E559 Vitamin D deficiency, unspecified: Secondary | ICD-10-CM | POA: Diagnosis not present

## 2023-01-08 DIAGNOSIS — N83201 Unspecified ovarian cyst, right side: Secondary | ICD-10-CM

## 2023-01-08 DIAGNOSIS — Z79899 Other long term (current) drug therapy: Secondary | ICD-10-CM

## 2023-01-08 DIAGNOSIS — R051 Acute cough: Secondary | ICD-10-CM | POA: Diagnosis not present

## 2023-01-08 DIAGNOSIS — E7800 Pure hypercholesterolemia, unspecified: Secondary | ICD-10-CM

## 2023-01-08 LAB — CBC WITH DIFFERENTIAL/PLATELET
Basophils Absolute: 0.1 10*3/uL (ref 0.0–0.1)
Basophils Relative: 1 % (ref 0.0–3.0)
Eosinophils Absolute: 0.5 10*3/uL (ref 0.0–0.7)
Eosinophils Relative: 6.3 % — ABNORMAL HIGH (ref 0.0–5.0)
HCT: 39.3 % (ref 36.0–46.0)
Hemoglobin: 13 g/dL (ref 12.0–15.0)
Lymphocytes Relative: 16.3 % (ref 12.0–46.0)
Lymphs Abs: 1.2 10*3/uL (ref 0.7–4.0)
MCHC: 33.2 g/dL (ref 30.0–36.0)
MCV: 90.9 fl (ref 78.0–100.0)
Monocytes Absolute: 0.5 10*3/uL (ref 0.1–1.0)
Monocytes Relative: 7.1 % (ref 3.0–12.0)
Neutro Abs: 5.3 10*3/uL (ref 1.4–7.7)
Neutrophils Relative %: 69.3 % (ref 43.0–77.0)
Platelets: 249 10*3/uL (ref 150.0–400.0)
RBC: 4.32 Mil/uL (ref 3.87–5.11)
RDW: 13 % (ref 11.5–15.5)
WBC: 7.6 10*3/uL (ref 4.0–10.5)

## 2023-01-08 LAB — BASIC METABOLIC PANEL
BUN: 14 mg/dL (ref 6–23)
CO2: 26 mEq/L (ref 19–32)
Calcium: 9.4 mg/dL (ref 8.4–10.5)
Chloride: 105 mEq/L (ref 96–112)
Creatinine, Ser: 1 mg/dL (ref 0.40–1.20)
GFR: 52.11 mL/min — ABNORMAL LOW (ref >60.00)
Glucose, Bld: 89 mg/dL (ref 70–99)
Potassium: 4.3 mEq/L (ref 3.5–5.1)
Sodium: 141 mEq/L (ref 135–145)

## 2023-01-08 LAB — HEPATIC FUNCTION PANEL
ALT: 16 U/L (ref 0–35)
AST: 19 U/L (ref 0–37)
Albumin: 4.3 g/dL (ref 3.5–5.2)
Alkaline Phosphatase: 61 U/L (ref 39–117)
Bilirubin, Direct: 0.1 mg/dL (ref 0.0–0.3)
Total Bilirubin: 0.5 mg/dL (ref 0.2–1.2)
Total Protein: 6.6 g/dL (ref 6.0–8.3)

## 2023-01-08 LAB — LIPID PANEL
Cholesterol: 143 mg/dL (ref 0–200)
HDL: 52.8 mg/dL (ref >39.00)
LDL Cholesterol: 69 mg/dL (ref 0–99)
NonHDL: 90.13
Total CHOL/HDL Ratio: 3
Triglycerides: 107 mg/dL (ref 0.0–149.0)
VLDL: 21.4 mg/dL (ref 0.0–40.0)

## 2023-01-08 LAB — TSH: TSH: 4.29 u[IU]/mL (ref 0.35–5.50)

## 2023-01-08 NOTE — Assessment & Plan Note (Signed)
Unclear etiology.  No rash.  Improved.  Discussed further w/up.  Is better.  Wants to follow.

## 2023-01-08 NOTE — Assessment & Plan Note (Signed)
Evaluated by Dr Leafy Ro.  Had f/u pelvic ultrasound 03/27/22.  Stable.  Recommended 6 month f/u.  Discussed with her today. She declines.  Did agree to check CA 125.

## 2023-01-08 NOTE — Assessment & Plan Note (Signed)
Physical today 01/08/23.  Declines colonoscopy.  Mammogram 12/04/22 - Birads I.  Bone density 04/03/22 - normal.

## 2023-01-08 NOTE — Assessment & Plan Note (Signed)
Some clearing of throat and congestion with minimal cough.  Discussed.  No fever.  No sob.  Discussed f/u chest CT and further w/up.  Wants to hold on any further testing.  Treat acid reflux - pepcid.  Saline nasal spray as directed.  Follow.

## 2023-01-08 NOTE — Assessment & Plan Note (Signed)
On crestor now.  Tolerating.   Low-cholesterol diet and exercise.  Check fasting lipid profile and liver panel.

## 2023-01-08 NOTE — Assessment & Plan Note (Signed)
Previously worked up by oncology.  Last CT scan reported stable.  Discussed again with her regarding follow-up CT scan.  Breathing stable.  She declines any further work-up.  Will notify me if she change her mind.

## 2023-01-08 NOTE — Assessment & Plan Note (Addendum)
Has wanted to remain on estrogen.

## 2023-01-08 NOTE — Progress Notes (Signed)
Subjective:    Patient ID: Kristin Phelps, female    DOB: [birth year], 82 y.o.   MRN: [id number]  Patient here for  Chief Complaint  Patient presents with   Annual Exam    HPI Here for physical exam. Reports she is doing relatively well. Reports that over the last 2-3 months, she has had itching - bilateral arms.  No known triggers.  No rash. Some itching left elbow.  She also reports some persistent clearing of her throat. Some congestion.  Some cough.  No chest congestion. No sob.  No chest pain.  No vomiting or diarrhea now.  History of ovarian cyst.  Evaluated Dr Leafy Ro.  Had pelvic ultrasound 03/2022.  Recommended 6 month f/u.  She desires to hold on f/u ultrasound at this time.  Stays active.     Past Medical History:  Diagnosis Date   Arthritis    Cancer ([place])    basal cell   Hyperlipidemia    Nephrolithiasis    Postmenopausal HRT (hormone replacement therapy)    Pulmonary nodules    Raynaud's phenomenon    Renal stone    Past Surgical History:  Procedure Laterality Date   anal fissure surgery  9/06   LITHOTRIPSY     TONSILLECTOMY     Family History  Problem Relation Age of Onset   Breast cancer Mother        died age 74   Heart disease Father        s/p CABG   Aneurysm Father    Breast cancer Other        maternal niece   Colon cancer Neg Hx    Social History   Socioeconomic History   Marital status: Married    Spouse name: Tom   Number of children: 2   Years of education: Not on file   Highest education level: Not on file  Occupational History    Employer: [employer]  Tobacco Use   Smoking status: Never   Smokeless tobacco: Never  Substance and Sexual Activity   Alcohol use: No    Alcohol/week: 0.0 standard drinks of alcohol   Drug use: No   Sexual activity: Not Currently  Other Topics Concern   Not on file  Social History Narrative   Not on file   Social Determinants of Health   Financial Resource Strain: Not on file   Food Insecurity: Not on file  Transportation Needs: Not on file  Physical Activity: Not on file  Stress: Not on file  Social Connections: Not on file     Review of Systems  Constitutional:  Negative for appetite change and unexpected weight change.  HENT:  Negative for congestion, sinus pressure and sore throat.        Throat congestion.  Clearing throat.   Eyes:  Negative for pain and visual disturbance.  Respiratory:  Negative for chest tightness and shortness of breath.        Some cough.   Cardiovascular:  Negative for chest pain, palpitations and leg swelling.  Gastrointestinal:  Negative for abdominal pain, diarrhea, nausea and vomiting.  Genitourinary:  Negative for difficulty urinating and dysuria.  Musculoskeletal:  Negative for joint swelling and myalgias.  Skin:  Negative for color change and rash.  Neurological:  Negative for dizziness and headaches.  Hematological:  Negative for adenopathy. Does not bruise/bleed easily.  Psychiatric/Behavioral:  Negative for agitation and dysphoric mood.        Objective:  BP 130/68   Pulse 78   Temp 97.6 F (36.4 C)   Resp 16   Ht '5\' 5"'$  (1.651 m)   Wt 155 lb 12.8 oz (70.7 kg)   SpO2 98%   BMI 25.93 kg/m  Wt Readings from Last 3 Encounters:  01/08/23 155 lb 12.8 oz (70.7 kg)  07/03/22 157 lb 3.2 oz (71.3 kg)  12/26/21 163 lb (73.9 kg)    Physical Exam Vitals reviewed.  Constitutional:      General: She is not in acute distress.    Appearance: Normal appearance. She is well-developed.  HENT:     Head: Normocephalic and atraumatic.     Right Ear: External ear normal.     Left Ear: External ear normal.  Eyes:     General: No scleral icterus.       Right eye: No discharge.        Left eye: No discharge.     Conjunctiva/sclera: Conjunctivae normal.  Neck:     Thyroid: No thyromegaly.  Cardiovascular:     Rate and Rhythm: Normal rate and regular rhythm.  Pulmonary:     Effort: No tachypnea, accessory muscle  usage or respiratory distress.     Breath sounds: Normal breath sounds. No decreased breath sounds or wheezing.  Chest:  Breasts:    Right: No inverted nipple, mass, nipple discharge or tenderness (no axillary adenopathy).     Left: No inverted nipple, mass, nipple discharge or tenderness (no axilarry adenopathy).  Abdominal:     General: Bowel sounds are normal.     Palpations: Abdomen is soft.     Tenderness: There is no abdominal tenderness.  Musculoskeletal:        General: No swelling or tenderness.     Cervical back: Neck supple.  Lymphadenopathy:     Cervical: No cervical adenopathy.  Skin:    Findings: No erythema or rash.  Neurological:     Mental Status: She is alert and oriented to person, place, and time.  Psychiatric:        Mood and Affect: Mood normal.        Behavior: Behavior normal.      Outpatient Encounter Medications as of 01/08/2023  Medication Sig   Cholecalciferol (VITAMIN D-3) 1000 UNITS CAPS Take 1 capsule by mouth daily.   estradiol (ESTRACE) 1 MG tablet Take 1 tablet by mouth once daily   medroxyPROGESTERone (PROVERA) 2.5 MG tablet Take 1 tablet by mouth once daily   polycarbophil (FIBERCON) 625 MG tablet Take 625 mg by mouth daily.   rosuvastatin (CRESTOR) 20 MG tablet Take 1 tablet by mouth once daily   No facility-administered encounter medications on file as of 01/08/2023.     Lab Results  Component Value Date   WBC 7.6 01/08/2023   HGB 13.0 01/08/2023   HCT 39.3 01/08/2023   PLT 249.0 01/08/2023   GLUCOSE 89 01/08/2023   CHOL 143 01/08/2023   TRIG 107.0 01/08/2023   HDL 52.80 01/08/2023   LDLCALC 69 01/08/2023   ALT 16 01/08/2023   AST 19 01/08/2023   NA 141 01/08/2023   K 4.3 01/08/2023   CL 105 01/08/2023   CREATININE 1.00 01/08/2023   BUN 14 01/08/2023   CO2 26 01/08/2023   TSH 4.29 01/08/2023    MM 3D SCREEN BREAST BILATERAL  Result Date: 12/05/2022 CLINICAL DATA:  Screening. EXAM: DIGITAL SCREENING BILATERAL MAMMOGRAM WITH  TOMOSYNTHESIS AND CAD TECHNIQUE: Bilateral screening digital craniocaudal and mediolateral oblique mammograms were obtained.  Bilateral screening digital breast tomosynthesis was performed. The images were evaluated with computer-aided detection. COMPARISON:  Previous exam(s). ACR Breast Density Category c: The breast tissue is heterogeneously dense, which may obscure small masses. FINDINGS: There are no findings suspicious for malignancy. IMPRESSION: No mammographic evidence of malignancy. A result letter of this screening mammogram will be mailed directly to the patient. RECOMMENDATION: Screening mammogram in one year. (Code:SM-B-01Y) BI-RADS CATEGORY  1: Negative. Electronically Signed   By: Everlean Alstrom M.D.   On: 12/05/2022 09:50       Assessment & Plan:  Health care maintenance Assessment & Plan: Physical today 01/08/23.  Declines colonoscopy.  Mammogram 12/04/22 - Birads I.  Bone density 04/03/22 - normal.    Hypercholesterolemia Assessment & Plan: On crestor now.  Tolerating.   Low-cholesterol diet and exercise.  Check fasting lipid profile and liver panel.   Orders: -     CBC with Differential/Platelet -     Hepatic function panel -     Basic metabolic panel -     Lipid panel -     TSH  Cyst of right ovary Assessment & Plan: Evaluated by Dr Leafy Ro.  Had f/u pelvic ultrasound 03/27/22.  Stable.  Recommended 6 month f/u.  Discussed with her today. She declines.  Did agree to check CA 125.   Orders: -     CA 125  Abnormal chest CT Assessment & Plan: Previously worked up by oncology.  Last CT scan reported stable.  Discussed again with her regarding follow-up CT scan.  Breathing stable.  She declines any further work-up.  Will notify me if she change her mind.   Aortic atherosclerosis ([place]) Assessment & Plan: Continue crestor.    Acute cough Assessment & Plan: Some clearing of throat and congestion with minimal cough.  Discussed.  No fever.  No sob.  Discussed f/u chest CT  and further w/up.  Wants to hold on any further testing.  Treat acid reflux - pepcid.  Saline nasal spray as directed.  Follow.    Current use of estrogen therapy Assessment & Plan: Has wanted to remain on estrogen.   Vitamin D deficiency Assessment & Plan: Follow vitamin d level.    Itching Assessment & Plan: Unclear etiology.  No rash.  Improved.  Discussed further w/up.  Is better.  Wants to follow.       Einar Pheasant, MD

## 2023-01-08 NOTE — Assessment & Plan Note (Signed)
Continue crestor 

## 2023-01-08 NOTE — Assessment & Plan Note (Signed)
Follow vitamin d level.   

## 2023-01-09 LAB — CA 125: CA 125: 8 U/mL (ref <35)

## 2023-02-12 ENCOUNTER — Other Ambulatory Visit: Payer: Self-pay | Admitting: Internal Medicine

## 2023-03-17 ENCOUNTER — Other Ambulatory Visit: Payer: Self-pay | Admitting: Internal Medicine

## 2023-07-03 ENCOUNTER — Encounter (INDEPENDENT_AMBULATORY_CARE_PROVIDER_SITE_OTHER): Payer: Self-pay

## 2023-07-16 ENCOUNTER — Encounter: Payer: Self-pay | Admitting: Internal Medicine

## 2023-07-16 ENCOUNTER — Ambulatory Visit: Payer: Medicare HMO | Admitting: Internal Medicine

## 2023-07-16 VITALS — BP 128/70 | HR 70 | Temp 97.9°F | Resp 16 | Ht 65.0 in | Wt 153.6 lb

## 2023-07-16 DIAGNOSIS — I700 Atherosclerosis of aorta: Secondary | ICD-10-CM

## 2023-07-16 DIAGNOSIS — R9389 Abnormal findings on diagnostic imaging of other specified body structures: Secondary | ICD-10-CM

## 2023-07-16 DIAGNOSIS — N83201 Unspecified ovarian cyst, right side: Secondary | ICD-10-CM

## 2023-07-16 DIAGNOSIS — E7800 Pure hypercholesterolemia, unspecified: Secondary | ICD-10-CM

## 2023-07-16 DIAGNOSIS — R0981 Nasal congestion: Secondary | ICD-10-CM | POA: Diagnosis not present

## 2023-07-16 DIAGNOSIS — L299 Pruritus, unspecified: Secondary | ICD-10-CM | POA: Diagnosis not present

## 2023-07-16 DIAGNOSIS — R197 Diarrhea, unspecified: Secondary | ICD-10-CM

## 2023-07-16 LAB — HEPATIC FUNCTION PANEL
ALT: 19 U/L (ref 0–35)
AST: 20 U/L (ref 0–37)
Albumin: 4 g/dL (ref 3.5–5.2)
Alkaline Phosphatase: 54 U/L (ref 39–117)
Bilirubin, Direct: 0.1 mg/dL (ref 0.0–0.3)
Total Bilirubin: 0.5 mg/dL (ref 0.2–1.2)
Total Protein: 6.6 g/dL (ref 6.0–8.3)

## 2023-07-16 LAB — LIPID PANEL
Cholesterol: 127 mg/dL (ref 0–200)
HDL: 40.5 mg/dL (ref >39.00)
LDL Cholesterol: 63 mg/dL (ref 0–99)
NonHDL: 86.32
Total CHOL/HDL Ratio: 3
Triglycerides: 115 mg/dL (ref 0.0–149.0)
VLDL: 23 mg/dL (ref 0.0–40.0)

## 2023-07-16 LAB — BASIC METABOLIC PANEL
BUN: 13 mg/dL (ref 6–23)
CO2: 29 mEq/L (ref 19–32)
Calcium: 9.4 mg/dL (ref 8.4–10.5)
Chloride: 101 mEq/L (ref 96–112)
Creatinine, Ser: 0.99 mg/dL (ref 0.40–1.20)
GFR: 52.55 mL/min — ABNORMAL LOW (ref >60.00)
Glucose, Bld: 86 mg/dL (ref 70–99)
Potassium: 3.9 mEq/L (ref 3.5–5.1)
Sodium: 138 mEq/L (ref 135–145)

## 2023-07-16 MED ORDER — MEDROXYPROGESTERONE ACETATE 2.5 MG PO TABS: 2.5000 mg | ORAL_TABLET | Freq: Every day | ORAL | 1 refills | Status: DC

## 2023-07-16 MED ORDER — ESTRADIOL 1 MG PO TABS: 1.0000 mg | ORAL_TABLET | Freq: Every day | ORAL | 1 refills | Status: DC

## 2023-07-16 NOTE — Progress Notes (Signed)
Subjective:    Patient ID: Kristin Phelps, female    DOB: 1941/02/03, 82 y.o.   MRN: [id number]  Patient here for  Chief Complaint  Patient presents with   Medical Management of Chronic Issues    HPI Here to follow up regarding hypercholesterolemia.  History of ovarian cyst.  Evaluated Dr Dalbert Garnet.  Had pelvic ultrasound 03/2022.  Recommended 6 month f/u.  She desires to hold on f/u ultrasound at this time. Did agree to f/u CA 125.  States her husband was sick recently - cough.  She reports that last week - she had sore throat - white spots. Gargled with salt water.  Took tylenol. Ears stopped up.  Some cough.  Increased drainage.  Sore back. Using saline.  Took mucinex x 2.  No vomiting or diarrhea.  Some nausea.  Reports feeling better now. Reports intermittent episodes of diarrhea. Flares on average q 2 weeks.  No known triggers  (occurred - 03/30/23, 05/12/23, 06/16/23 and 07/07/23).  Had itching for three weeks. No significant itching now.  No rash.     Past Medical History:  Diagnosis Date   Arthritis    Cancer ([facility])    basal cell   Hyperlipidemia    Nephrolithiasis    Postmenopausal HRT (hormone replacement therapy)    Pulmonary nodules    Raynaud's phenomenon    Renal stone    Past Surgical History:  Procedure Laterality Date   anal fissure surgery  9/06   LITHOTRIPSY     TONSILLECTOMY     Family History  Problem Relation Age of Onset   Breast cancer Mother        died age 72   Heart disease Father        s/p CABG   Aneurysm Father    Breast cancer Other        maternal niece   Colon cancer Neg Hx    Social History   Socioeconomic History   Marital status: Married    Spouse name: Tom   Number of children: 2   Years of education: Not on file   Highest education level: Not on file  Occupational History    Employer: [employer]  Tobacco Use   Smoking status: Never   Smokeless tobacco: Never  Substance and Sexual Activity   Alcohol use: No     Alcohol/week: 0.0 standard drinks of alcohol   Drug use: No   Sexual activity: Not Currently  Other Topics Concern   Not on file  Social History Narrative   Not on file   Social Determinants of Health   Financial Resource Strain: Not on file  Food Insecurity: Not on file  Transportation Needs: Not on file  Physical Activity: Not on file  Stress: Not on file  Social Connections: Not on file     Review of Systems  Constitutional:  Negative for appetite change, fever and unexpected weight change.  HENT:  Positive for congestion and postnasal drip.        No sore throat now.   Respiratory:  Negative for chest tightness and shortness of breath.        Previous cough.  No significant cough now.   Cardiovascular:  Negative for chest pain, palpitations and leg swelling.  Gastrointestinal:  Negative for abdominal pain and vomiting.       Intermittent diarrhea as outlined.   Genitourinary:  Negative for difficulty urinating and dysuria.  Musculoskeletal:  Negative for joint swelling and  myalgias.  Skin:  Negative for color change and rash.       Previous itching.    Neurological:  Negative for dizziness and headaches.  Psychiatric/Behavioral:  Negative for agitation and dysphoric mood.        Objective:     BP 128/70   Pulse 70   Temp 97.9 F (36.6 C)   Resp 16   Ht 5\' 5"  (1.651 m)   Wt 153 lb 9.6 oz (69.7 kg)   SpO2 97%   BMI 25.56 kg/m  Wt Readings from Last 3 Encounters:  07/16/23 153 lb 9.6 oz (69.7 kg)  01/08/23 155 lb 12.8 oz (70.7 kg)  07/03/22 157 lb 3.2 oz (71.3 kg)    Physical Exam Vitals reviewed.  Constitutional:      General: She is not in acute distress.    Appearance: Normal appearance.  HENT:     Head: Normocephalic and atraumatic.     Right Ear: External ear normal.     Left Ear: External ear normal.  Eyes:     General: No scleral icterus.       Right eye: No discharge.        Left eye: No discharge.     Conjunctiva/sclera: Conjunctivae  normal.  Neck:     Thyroid: No thyromegaly.  Cardiovascular:     Rate and Rhythm: Normal rate and regular rhythm.  Pulmonary:     Effort: No respiratory distress.     Breath sounds: Normal breath sounds. No wheezing.  Abdominal:     General: Bowel sounds are normal.     Palpations: Abdomen is soft.     Tenderness: There is no abdominal tenderness.  Musculoskeletal:        General: No swelling or tenderness.     Cervical back: Neck supple. No tenderness.  Lymphadenopathy:     Cervical: No cervical adenopathy.  Skin:    Findings: No erythema or rash.  Neurological:     Mental Status: She is alert.  Psychiatric:        Mood and Affect: Mood normal.        Behavior: Behavior normal.      Outpatient Encounter Medications as of 07/16/2023  Medication Sig   Cholecalciferol (VITAMIN D-3) 1000 UNITS CAPS Take 1 capsule by mouth daily.   polycarbophil (FIBERCON) 625 MG tablet Take 625 mg by mouth daily.   rosuvastatin (CRESTOR) 20 MG tablet Take 1 tablet by mouth once daily   [DISCONTINUED] estradiol (ESTRACE) 1 MG tablet Take 1 tablet by mouth once daily   [DISCONTINUED] medroxyPROGESTERone (PROVERA) 2.5 MG tablet Take 1 tablet by mouth once daily   estradiol (ESTRACE) 1 MG tablet Take 1 tablet (1 mg total) by mouth daily.   medroxyPROGESTERone (PROVERA) 2.5 MG tablet Take 1 tablet (2.5 mg total) by mouth daily.   No facility-administered encounter medications on file as of 07/16/2023.     Lab Results  Component Value Date   WBC 7.6 01/08/2023   HGB 13.0 01/08/2023   HCT 39.3 01/08/2023   PLT 249.0 01/08/2023   GLUCOSE 86 07/16/2023   CHOL 127 07/16/2023   TRIG 115.0 07/16/2023   HDL 40.50 07/16/2023   LDLCALC 63 07/16/2023   ALT 19 07/16/2023   AST 20 07/16/2023   NA 138 07/16/2023   K 3.9 07/16/2023   CL 101 07/16/2023   CREATININE 0.99 07/16/2023   BUN 13 07/16/2023   CO2 29 07/16/2023   TSH 4.29 01/08/2023    MM  3D SCREEN BREAST BILATERAL  Result Date:  12/05/2022 CLINICAL DATA:  Screening. EXAM: DIGITAL SCREENING BILATERAL MAMMOGRAM WITH TOMOSYNTHESIS AND CAD TECHNIQUE: Bilateral screening digital craniocaudal and mediolateral oblique mammograms were obtained. Bilateral screening digital breast tomosynthesis was performed. The images were evaluated with computer-aided detection. COMPARISON:  Previous exam(s). ACR Breast Density Category c: The breast tissue is heterogeneously dense, which may obscure small masses. FINDINGS: There are no findings suspicious for malignancy. IMPRESSION: No mammographic evidence of malignancy. A result letter of this screening mammogram will be mailed directly to the patient. RECOMMENDATION: Screening mammogram in one year. (Code:SM-B-01Y) BI-RADS CATEGORY  1: Negative. Electronically Signed   By: Edwin Cap M.D.   On: 12/05/2022 09:50       Assessment & Plan:  Hypercholesterolemia Assessment & Plan: On crestor now.  Tolerating.   Low-cholesterol diet and exercise.  Check fasting lipid profile and liver panel.   Orders: -     Basic metabolic panel -     Hepatic function panel -     Lipid panel  Cyst of right ovary Assessment & Plan: Evaluated by Dr Dalbert Garnet.  Had f/u pelvic ultrasound 03/27/22.  Stable.  Recommended 6 month f/u.  Discussed with her today. She declines.  Did agree to check CA 125.   Orders: -     CA 125  Abnormal chest CT Assessment & Plan: Previously worked up by oncology.  Last CT scan reported stable.  Discussed again with her regarding follow-up CT scan.  Breathing stable.  She declines any further work-up.  Will notify me if she change her mind.   Aortic atherosclerosis ([facility]) Assessment & Plan: Continue crestor.    Itching Assessment & Plan: Unclear etiology.  No rash.  Improved.  Discussed further w/up.  Is better.  Wants to follow.    Diarrhea, unspecified type Assessment & Plan: Persistent intermittent episodes as outlined.  Unknown trigger.  Will keep food diary and  follow for possible triggers.  Benefiber.  Discussed further w/up.  Will notify me if desires further testing/evaluation.    Nasal congestion Assessment & Plan: Nasal congestion, drainage and cough as outlined.  Better.  Saline nasal spray.  Mucinex.  Follow.  Call with update.    Other orders -     Estradiol; Take 1 tablet (1 mg total) by mouth daily.  Dispense: 90 tablet; Refill: 1 -     medroxyPROGESTERone Acetate; Take 1 tablet (2.5 mg total) by mouth daily.  Dispense: 90 tablet; Refill: 1     Dale [place], MD

## 2023-07-16 NOTE — Patient Instructions (Signed)
Salt water nose spray - flush nose a couple of time per day.    Mucinex if persistent mucus.

## 2023-07-21 ENCOUNTER — Encounter: Payer: Self-pay | Admitting: Internal Medicine

## 2023-07-21 DIAGNOSIS — R197 Diarrhea, unspecified: Secondary | ICD-10-CM | POA: Insufficient documentation

## 2023-07-21 DIAGNOSIS — R0981 Nasal congestion: Secondary | ICD-10-CM | POA: Insufficient documentation

## 2023-07-21 NOTE — Assessment & Plan Note (Signed)
On crestor now.  Tolerating.   Low-cholesterol diet and exercise.  Check fasting lipid profile and liver panel.  

## 2023-07-21 NOTE — Assessment & Plan Note (Signed)
Persistent intermittent episodes as outlined.  Unknown trigger.  Will keep food diary and follow for possible triggers.  Benefiber.  Discussed further w/up.  Will notify me if desires further testing/evaluation.

## 2023-07-21 NOTE — Assessment & Plan Note (Signed)
Continue crestor 

## 2023-07-21 NOTE — Assessment & Plan Note (Signed)
Nasal congestion, drainage and cough as outlined.  Better.  Saline nasal spray.  Mucinex.  Follow.  Call with update.

## 2023-07-21 NOTE — Assessment & Plan Note (Signed)
Evaluated by Dr Leafy Ro.  Had f/u pelvic ultrasound 03/27/22.  Stable.  Recommended 6 month f/u.  Discussed with her today. She declines.  Did agree to check CA 125.

## 2023-07-21 NOTE — Assessment & Plan Note (Signed)
Previously worked up by oncology.  Last CT scan reported stable.  Discussed again with her regarding follow-up CT scan.  Breathing stable.  She declines any further work-up.  Will notify me if she change her mind.

## 2023-07-21 NOTE — Assessment & Plan Note (Signed)
Unclear etiology.  No rash.  Improved.  Discussed further w/up.  Is better.  Wants to follow.

## 2023-09-06 DIAGNOSIS — H524 Presbyopia: Secondary | ICD-10-CM | POA: Diagnosis not present

## 2023-10-18 ENCOUNTER — Encounter: Payer: Self-pay | Admitting: Internal Medicine

## 2023-10-18 ENCOUNTER — Ambulatory Visit (INDEPENDENT_AMBULATORY_CARE_PROVIDER_SITE_OTHER): Payer: Medicare HMO

## 2023-10-18 ENCOUNTER — Ambulatory Visit (INDEPENDENT_AMBULATORY_CARE_PROVIDER_SITE_OTHER): Payer: Medicare HMO | Admitting: Internal Medicine

## 2023-10-18 VITALS — BP 122/76 | HR 77 | Temp 98.1°F | Ht 65.0 in | Wt 155.4 lb

## 2023-10-18 DIAGNOSIS — R053 Chronic cough: Secondary | ICD-10-CM

## 2023-10-18 DIAGNOSIS — E7800 Pure hypercholesterolemia, unspecified: Secondary | ICD-10-CM

## 2023-10-18 DIAGNOSIS — N83201 Unspecified ovarian cyst, right side: Secondary | ICD-10-CM | POA: Diagnosis not present

## 2023-10-18 DIAGNOSIS — I700 Atherosclerosis of aorta: Secondary | ICD-10-CM

## 2023-10-18 DIAGNOSIS — R9389 Abnormal findings on diagnostic imaging of other specified body structures: Secondary | ICD-10-CM | POA: Diagnosis not present

## 2023-10-18 DIAGNOSIS — R252 Cramp and spasm: Secondary | ICD-10-CM

## 2023-10-18 NOTE — Progress Notes (Unsigned)
Subjective:    Patient ID: Kristin Phelps, female    DOB: 1941/01/29, 82 y.o.   MRN: [id number]  Patient here for  Chief Complaint  Patient presents with   Medical Management of Chronic Issues    3 month    HPI Here to follow up regarding hypercholesterolemia.  History of ovarian cyst.  Evaluated Dr Dalbert Garnet.  Had pelvic ultrasound 03/2022.  Recommended 6 month f/u.  She desires to hold on f/u ultrasound. Did agree to f/u CA 125. Last CA 125 - stable- 7. She reports she has noticed some mid back pain. Reports - less cough.  Breathing overall stable. Discussed cxr.  Had discussed scanning previously.  No chest pain.  No abdominal pain reported.  Reports leg cramps - occurring at night.    Past Medical History:  Diagnosis Date   Arthritis    Cancer (HCC)    basal cell   Hyperlipidemia    Nephrolithiasis    Postmenopausal HRT (hormone replacement therapy)    Pulmonary nodules    Raynaud's phenomenon    Renal stone    Past Surgical History:  Procedure Laterality Date   anal fissure surgery  9/06   LITHOTRIPSY     TONSILLECTOMY     Family History  Problem Relation Age of Onset   Breast cancer Mother        died age 16   Heart disease Father        s/p CABG   Aneurysm Father    Breast cancer Other        maternal niece   Colon cancer Neg Hx    Social History   Socioeconomic History   Marital status: Married    Spouse name: Tom   Number of children: 2   Years of education: Not on file   Highest education level: Associate degree: academic program  Occupational History    Employer: [employer]  Tobacco Use   Smoking status: Never   Smokeless tobacco: Never  Substance and Sexual Activity   Alcohol use: No    Alcohol/week: 0.0 standard drinks of alcohol   Drug use: No   Sexual activity: Not Currently  Other Topics Concern   Not on file  Social History Narrative   Not on file   Social Determinants of Health   Financial Resource Strain: Low  Risk  (10/14/2023)   Overall Financial Resource Strain (CARDIA)    Difficulty of Paying Living Expenses: Not hard at all  Food Insecurity: No Food Insecurity (10/14/2023)   Hunger Vital Sign    Worried About Running Out of Food in the Last Year: Never true    Ran Out of Food in the Last Year: Never true  Transportation Needs: No Transportation Needs (10/14/2023)   PRAPARE - Administrator, Civil Service (Medical): No    Lack of Transportation (Non-Medical): No  Physical Activity: Unknown (10/14/2023)   Exercise Vital Sign    Days of Exercise per Week: 0 days    Minutes of Exercise per Session: Not on file  Stress: No Stress Concern Present (10/14/2023)   [employer] of Occupational Health - Occupational Stress Questionnaire    Feeling of Stress : Not at all  Social Connections: Socially Integrated (10/14/2023)   Social Connection and Isolation Panel [NHANES]    Frequency of Communication with Friends and Family: More than three times a week    Frequency of Social Gatherings with Friends and Family: Once a week  Attends Religious Services: More than 4 times per year    Active Member of Clubs or Organizations: Yes    Attends Banker Meetings: More than 4 times per year    Marital Status: Married     Review of Systems  Constitutional:  Negative for appetite change and unexpected weight change.  HENT:  Negative for congestion and sinus pressure.   Respiratory:  Negative for chest tightness and wheezing.        Less cough. Breathing stable.   Cardiovascular:  Negative for chest pain and palpitations.  Gastrointestinal:  Negative for abdominal pain, diarrhea, nausea and vomiting.  Genitourinary:  Negative for difficulty urinating and dysuria.  Musculoskeletal:  Positive for back pain. Negative for myalgias.  Skin:  Negative for color change and rash.  Neurological:  Negative for dizziness and headaches.  Psychiatric/Behavioral:  Negative for agitation  and dysphoric mood.        Objective:     BP 122/76   Pulse 77   Temp 98.1 F (36.7 C) (Oral)   Ht 5\' 5"  (1.651 m)   Wt 155 lb 6.4 oz (70.5 kg)   SpO2 95%   BMI 25.86 kg/m  Wt Readings from Last 3 Encounters:  10/18/23 155 lb 6.4 oz (70.5 kg)  07/16/23 153 lb 9.6 oz (69.7 kg)  01/08/23 155 lb 12.8 oz (70.7 kg)    Physical Exam Vitals reviewed.  Constitutional:      General: She is not in acute distress.    Appearance: Normal appearance.  HENT:     Head: Normocephalic and atraumatic.     Right Ear: External ear normal.     Left Ear: External ear normal.  Eyes:     General: No scleral icterus.       Right eye: No discharge.        Left eye: No discharge.     Conjunctiva/sclera: Conjunctivae normal.  Neck:     Thyroid: No thyromegaly.  Cardiovascular:     Rate and Rhythm: Normal rate and regular rhythm.  Pulmonary:     Effort: No respiratory distress.     Breath sounds: Normal breath sounds. No wheezing.  Abdominal:     General: Bowel sounds are normal.     Palpations: Abdomen is soft.     Tenderness: There is no abdominal tenderness.  Musculoskeletal:        General: No swelling or tenderness.     Cervical back: Neck supple. No tenderness.  Lymphadenopathy:     Cervical: No cervical adenopathy.  Skin:    Findings: No erythema or rash.  Neurological:     Mental Status: She is alert.  Psychiatric:        Mood and Affect: Mood normal.        Behavior: Behavior normal.      Outpatient Encounter Medications as of 10/18/2023  Medication Sig   Cholecalciferol (VITAMIN D-3) 1000 UNITS CAPS Take 1 capsule by mouth daily.   estradiol (ESTRACE) 1 MG tablet Take 1 tablet (1 mg total) by mouth daily.   medroxyPROGESTERone (PROVERA) 2.5 MG tablet Take 1 tablet (2.5 mg total) by mouth daily.   polycarbophil (FIBERCON) 625 MG tablet Take 625 mg by mouth daily.   rosuvastatin (CRESTOR) 20 MG tablet Take 1 tablet by mouth once daily   No facility-administered  encounter medications on file as of 10/18/2023.     Lab Results  Component Value Date   WBC 7.6 01/08/2023   HGB 13.0 01/08/2023  HCT 39.3 01/08/2023   PLT 249.0 01/08/2023   GLUCOSE 86 07/16/2023   CHOL 127 07/16/2023   TRIG 115.0 07/16/2023   HDL 40.50 07/16/2023   LDLCALC 63 07/16/2023   ALT 19 07/16/2023   AST 20 07/16/2023   NA 138 07/16/2023   K 3.9 07/16/2023   CL 101 07/16/2023   CREATININE 0.99 07/16/2023   BUN 13 07/16/2023   CO2 29 07/16/2023   TSH 4.29 01/08/2023    MM 3D SCREEN BREAST BILATERAL  Result Date: 12/05/2022 CLINICAL DATA:  Screening. EXAM: DIGITAL SCREENING BILATERAL MAMMOGRAM WITH TOMOSYNTHESIS AND CAD TECHNIQUE: Bilateral screening digital craniocaudal and mediolateral oblique mammograms were obtained. Bilateral screening digital breast tomosynthesis was performed. The images were evaluated with computer-aided detection. COMPARISON:  Previous exam(s). ACR Breast Density Category c: The breast tissue is heterogeneously dense, which may obscure small masses. FINDINGS: There are no findings suspicious for malignancy. IMPRESSION: No mammographic evidence of malignancy. A result letter of this screening mammogram will be mailed directly to the patient. RECOMMENDATION: Screening mammogram in one year. (Code:SM-B-01Y) BI-RADS CATEGORY  1: Negative. Electronically Signed   By: Edwin Cap M.D.   On: 12/05/2022 09:50       Assessment & Plan:  Hypercholesterolemia Assessment & Plan: On crestor.  Low-cholesterol diet and exercise.  Follow fasting lipid profile and liver panel.   Orders: -     Basic metabolic panel; Future -     Hepatic function panel; Future -     Lipid panel; Future  Persistent cough Assessment & Plan: Some cough as outlined.  Less cough, but persistent. Mid back pain.  Check cxr.  Agreeable to xray.   Orders: -     DG Chest 2 View; Future  Cyst of right ovary Assessment & Plan: Evaluated by Dr Dalbert Garnet.  Had f/u pelvic  ultrasound 03/27/22.  Stable.  Recommended 6 month f/u.  She has wanted to hold on f/u pelvic ultrasound.  Agreed to f/u CA 125.  CA 125 07/2023 - 7.    Aortic atherosclerosis (HCC) Assessment & Plan: Continue crestor.    Abnormal chest CT Assessment & Plan: Previously worked up by oncology.  Last CT scan reported stable.  Have discussed follow-up CT scan.  Breathing stable. Agreeable to cxr.    Leg cramps Assessment & Plan: Stay hydrated.  Stretches.  Follow.       Dale Gosport, MD

## 2023-10-19 ENCOUNTER — Encounter: Payer: Self-pay | Admitting: Internal Medicine

## 2023-10-19 DIAGNOSIS — R252 Cramp and spasm: Secondary | ICD-10-CM | POA: Insufficient documentation

## 2023-10-19 NOTE — Assessment & Plan Note (Signed)
Some cough as outlined.  Less cough, but persistent. Mid back pain.  Check cxr.  Agreeable to xray.

## 2023-10-19 NOTE — Assessment & Plan Note (Signed)
Evaluated by Dr Dalbert Garnet.  Had f/u pelvic ultrasound 03/27/22.  Stable.  Recommended 6 month f/u.  She has wanted to hold on f/u pelvic ultrasound.  Agreed to f/u CA 125.  CA 125 07/2023 - 7.

## 2023-10-19 NOTE — Assessment & Plan Note (Signed)
Continue crestor 

## 2023-10-19 NOTE — Assessment & Plan Note (Signed)
Previously worked up by oncology.  Last CT scan reported stable.  Have discussed follow-up CT scan.  Breathing stable. Agreeable to cxr.

## 2023-10-19 NOTE — Assessment & Plan Note (Signed)
Stay hydrated.  Stretches.  Follow.

## 2023-10-19 NOTE — Assessment & Plan Note (Signed)
On crestor.  Low-cholesterol diet and exercise.  Follow fasting lipid profile and liver panel.

## 2023-11-22 DIAGNOSIS — H903 Sensorineural hearing loss, bilateral: Secondary | ICD-10-CM | POA: Diagnosis not present

## 2024-01-06 ENCOUNTER — Other Ambulatory Visit: Payer: Self-pay | Admitting: Internal Medicine

## 2024-01-06 DIAGNOSIS — Z1231 Encounter for screening mammogram for malignant neoplasm of breast: Secondary | ICD-10-CM

## 2024-01-10 ENCOUNTER — Ambulatory Visit
Admission: RE | Admit: 2024-01-10 | Discharge: 2024-01-10 | Disposition: A | Discharge status: Home / Self Care | Payer: Medicare HMO | Source: Ambulatory Visit | Attending: Internal Medicine | Admitting: Internal Medicine

## 2024-01-10 DIAGNOSIS — Z1231 Encounter for screening mammogram for malignant neoplasm of breast: Secondary | ICD-10-CM | POA: Diagnosis not present

## 2024-01-17 ENCOUNTER — Other Ambulatory Visit (INDEPENDENT_AMBULATORY_CARE_PROVIDER_SITE_OTHER): Payer: Medicare HMO

## 2024-01-17 DIAGNOSIS — E7800 Pure hypercholesterolemia, unspecified: Secondary | ICD-10-CM

## 2024-01-17 LAB — BASIC METABOLIC PANEL
BUN: 14 mg/dL (ref 6–23)
CO2: 31 meq/L (ref 19–32)
Calcium: 8.8 mg/dL (ref 8.4–10.5)
Chloride: 105 meq/L (ref 96–112)
Creatinine, Ser: 1 mg/dL (ref 0.40–1.20)
GFR: 51.74 mL/min — ABNORMAL LOW (ref >60.00)
Glucose, Bld: 85 mg/dL (ref 70–99)
Potassium: 4 meq/L (ref 3.5–5.1)
Sodium: 142 meq/L (ref 135–145)

## 2024-01-17 LAB — LIPID PANEL
Cholesterol: 127 mg/dL (ref 0–200)
HDL: 48.4 mg/dL (ref >39.00)
LDL Cholesterol: 60 mg/dL (ref 0–99)
NonHDL: 78.57
Total CHOL/HDL Ratio: 3
Triglycerides: 92 mg/dL (ref 0.0–149.0)
VLDL: 18.4 mg/dL (ref 0.0–40.0)

## 2024-01-17 LAB — HEPATIC FUNCTION PANEL
ALT: 14 U/L (ref 0–35)
AST: 19 U/L (ref 0–37)
Albumin: 3.9 g/dL (ref 3.5–5.2)
Alkaline Phosphatase: 62 U/L (ref 39–117)
Bilirubin, Direct: 0.1 mg/dL (ref 0.0–0.3)
Total Bilirubin: 0.5 mg/dL (ref 0.2–1.2)
Total Protein: 6.1 g/dL (ref 6.0–8.3)

## 2024-01-21 ENCOUNTER — Ambulatory Visit (INDEPENDENT_AMBULATORY_CARE_PROVIDER_SITE_OTHER): Payer: Medicare HMO | Admitting: Internal Medicine

## 2024-01-21 ENCOUNTER — Encounter: Payer: Self-pay | Admitting: Internal Medicine

## 2024-01-21 VITALS — BP 120/70 | HR 79 | Temp 98.0°F | Resp 16 | Ht 65.0 in | Wt 158.2 lb

## 2024-01-21 DIAGNOSIS — E2839 Other primary ovarian failure: Secondary | ICD-10-CM | POA: Diagnosis not present

## 2024-01-21 DIAGNOSIS — N83201 Unspecified ovarian cyst, right side: Secondary | ICD-10-CM | POA: Diagnosis not present

## 2024-01-21 DIAGNOSIS — I700 Atherosclerosis of aorta: Secondary | ICD-10-CM | POA: Diagnosis not present

## 2024-01-21 DIAGNOSIS — Z0000 Encounter for general adult medical examination without abnormal findings: Secondary | ICD-10-CM

## 2024-01-21 DIAGNOSIS — R9389 Abnormal findings on diagnostic imaging of other specified body structures: Secondary | ICD-10-CM

## 2024-01-21 DIAGNOSIS — E7800 Pure hypercholesterolemia, unspecified: Secondary | ICD-10-CM | POA: Diagnosis not present

## 2024-01-21 NOTE — Assessment & Plan Note (Signed)
 On crestor.  Low-cholesterol diet and exercise.  Follow fasting lipid profile and liver panel.

## 2024-01-21 NOTE — Progress Notes (Signed)
 Subjective:    Patient ID: Kristin Phelps, female    DOB: October 14, 1941, 83 y.o.   MRN: [id number]  Patient here for  Chief Complaint  Patient presents with   Annual Exam    HPI Here for a physical exam.  She reports she is doing well. Stays active. 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 No chest pain reported. Breathing stable. No increased cough or congestion. No abdominal pain or bowel change reported. Discussed labs.    Past Medical History:  Diagnosis Date   Arthritis    Cancer (HCC)    basal cell   Hyperlipidemia    Nephrolithiasis    Postmenopausal HRT (hormone replacement therapy)    Pulmonary nodules    Raynaud's phenomenon    Renal stone    Past Surgical History:  Procedure Laterality Date   anal fissure surgery  9/06   LITHOTRIPSY     TONSILLECTOMY     Family History  Problem Relation Age of Onset   Breast cancer Mother        died age 78   Heart disease Father        s/p CABG   Aneurysm Father    Breast cancer Other        maternal niece   Colon cancer Neg Hx    Social History   Socioeconomic History   Marital status: Married    Spouse name: Tom   Number of children: 2   Years of education: Not on file   Highest education level: Associate degree: academic program  Occupational History    Employer: [employer]  Tobacco Use   Smoking status: Never   Smokeless tobacco: Never  Substance and Sexual Activity   Alcohol use: No    Alcohol/week: 0.0 standard drinks of alcohol   Drug use: No   Sexual activity: Not Currently  Other Topics Concern   Not on file  Social History Narrative   Not on file   Social Drivers of Health   Financial Resource Strain: Low Risk  (01/17/2024)   Overall Financial Resource Strain (CARDIA)    Difficulty of Paying Living Expenses: Not hard at all  Food Insecurity: No Food Insecurity (01/17/2024)   Hunger Vital Sign    Worried About Running Out of Food in the Last Year: Never true    Ran Out of Food in  the Last Year: Never true  Transportation Needs: No Transportation Needs (01/17/2024)   PRAPARE - Administrator, Civil Service (Medical): No    Lack of Transportation (Non-Medical): No  Physical Activity: Unknown (01/17/2024)   Exercise Vital Sign    Days of Exercise per Week: Patient declined    Minutes of Exercise per Session: Not on file  Stress: Patient Declined (01/17/2024)   [employer] of Occupational Health - Occupational Stress Questionnaire    Feeling of Stress : Patient declined  Social Connections: Socially Integrated (01/17/2024)   Social Connection and Isolation Panel [NHANES]    Frequency of Communication with Friends and Family: More than three times a week    Frequency of Social Gatherings with Friends and Family: Once a week    Attends Religious Services: More than 4 times per year    Active Member of [employer] or Organizations: Yes    Attends Engineer, structural: More than 4 times per year    Marital Status: Married     Review of Systems  Constitutional:  Negative for appetite change and unexpected weight  change.  HENT:  Negative for congestion, sinus pressure and sore throat.   Eyes:  Negative for pain and visual disturbance.  Respiratory:  Negative for cough, chest tightness and shortness of breath.   Cardiovascular:  Negative for chest pain and palpitations.       No increased swelling.   Gastrointestinal:  Negative for abdominal pain, diarrhea, nausea and vomiting.  Genitourinary:  Negative for difficulty urinating, dysuria and frequency.  Musculoskeletal:  Negative for joint swelling and myalgias.  Skin:  Negative for color change and rash.  Neurological:  Negative for dizziness and headaches.  Hematological:  Negative for adenopathy. Does not bruise/bleed easily.  Psychiatric/Behavioral:  Negative for agitation and dysphoric mood.        Objective:     BP 120/70   Pulse 79   Temp 98 F (36.7 C)   Resp 16   Ht 5\' 5"  (1.651 m)    Wt 158 lb 3.2 oz (71.8 kg)   SpO2 98%   BMI 26.33 kg/m  Wt Readings from Last 3 Encounters:  01/21/24 158 lb 3.2 oz (71.8 kg)  10/18/23 155 lb 6.4 oz (70.5 kg)  07/16/23 153 lb 9.6 oz (69.7 kg)    Physical Exam Vitals reviewed.  Constitutional:      General: She is not in acute distress.    Appearance: Normal appearance. She is well-developed.  HENT:     Head: Normocephalic and atraumatic.     Right Ear: External ear normal.     Left Ear: External ear normal.     Mouth/Throat:     Pharynx: No oropharyngeal exudate or posterior oropharyngeal erythema.  Eyes:     General: No scleral icterus.       Right eye: No discharge.        Left eye: No discharge.     Conjunctiva/sclera: Conjunctivae normal.  Neck:     Thyroid: No thyromegaly.  Cardiovascular:     Rate and Rhythm: Normal rate and regular rhythm.  Pulmonary:     Effort: No tachypnea, accessory muscle usage or respiratory distress.     Breath sounds: Normal breath sounds. No decreased breath sounds or wheezing.  Chest:  Breasts:    Right: No inverted nipple, mass, nipple discharge or tenderness (no axillary adenopathy).     Left: No inverted nipple, mass, nipple discharge or tenderness (no axilarry adenopathy).  Abdominal:     General: Bowel sounds are normal.     Palpations: Abdomen is soft.     Tenderness: There is no abdominal tenderness.  Musculoskeletal:        General: No swelling or tenderness.     Cervical back: Neck supple.  Lymphadenopathy:     Cervical: No cervical adenopathy.  Skin:    Findings: No erythema or rash.  Neurological:     Mental Status: She is alert and oriented to person, place, and time.  Psychiatric:        Mood and Affect: Mood normal.        Behavior: Behavior normal.         Outpatient Encounter Medications as of 01/21/2024  Medication Sig   Cholecalciferol (VITAMIN D-3) 1000 UNITS CAPS Take 1 capsule by mouth daily.   estradiol (ESTRACE) 1 MG tablet Take 1 tablet (1 mg  total) by mouth daily.   medroxyPROGESTERone (PROVERA) 2.5 MG tablet Take 1 tablet (2.5 mg total) by mouth daily.   polycarbophil (FIBERCON) 625 MG tablet Take 625 mg by mouth daily.   rosuvastatin (  CRESTOR) 20 MG tablet Take 1 tablet by mouth once daily   No facility-administered encounter medications on file as of 01/21/2024.     Lab Results  Component Value Date   WBC 7.6 01/08/2023   HGB 13.0 01/08/2023   HCT 39.3 01/08/2023   PLT 249.0 01/08/2023   GLUCOSE 85 01/17/2024   CHOL 127 01/17/2024   TRIG 92.0 01/17/2024   HDL 48.40 01/17/2024   LDLCALC 60 01/17/2024   ALT 14 01/17/2024   AST 19 01/17/2024   NA 142 01/17/2024   K 4.0 01/17/2024   CL 105 01/17/2024   CREATININE 1.00 01/17/2024   BUN 14 01/17/2024   CO2 31 01/17/2024   TSH 4.29 01/08/2023    MM 3D SCREENING MAMMOGRAM BILATERAL BREAST Result Date: 01/14/2024 CLINICAL DATA:  Screening. EXAM: DIGITAL SCREENING BILATERAL MAMMOGRAM WITH TOMOSYNTHESIS AND CAD TECHNIQUE: Bilateral screening digital craniocaudal and mediolateral oblique mammograms were obtained. Bilateral screening digital breast tomosynthesis was performed. The images were evaluated with computer-aided detection. COMPARISON:  Previous exam(s). ACR Breast Density Category c: The breasts are heterogeneously dense, which may obscure small masses. FINDINGS: There are no findings suspicious for malignancy. IMPRESSION: No mammographic evidence of malignancy. A result letter of this screening mammogram will be mailed directly to the patient. RECOMMENDATION: Screening mammogram in one year. (Code:SM-B-01Y) BI-RADS CATEGORY  1: Negative. Electronically Signed   By: Hulan Saas M.D.   On: 01/14/2024 10:08       Assessment & Plan:  Routine general medical examination at a health care facility  Hypercholesterolemia Assessment & Plan: On crestor.  Low-cholesterol diet and exercise.  Follow fasting lipid profile and liver panel.   Orders: -     Hepatic function  panel; Future -     Basic metabolic panel; Future -     Lipid panel; Future  Cyst of right ovary Assessment & Plan: Evaluated by Dr Dalbert Garnet. Had f/u pelvic ultrasound 03/27/22 - stable. Recommended f/u in 6 months. She has wanted to hold on f/u pelvic ultrasound. Had CA 125 07/2023 - 7.  Agreeable to f/u CA 125 next labs.   Orders: -     CA 125; Future  Health care maintenance Assessment & Plan: Physical today 01/21/24.  Declines colonoscopy.  Mammogram 01/10/24 - Birads I.  Bone density 04/03/22 - normal.    Abnormal chest CT Assessment & Plan: Previously worked up by oncology. Last CT scan stable. Have discussed f/u CT.  Breathing stable. Recent CXR ok.    Aortic atherosclerosis (HCC) Assessment & Plan: Continue crestor.    Estrogen deficiency Assessment & Plan: Bone density normal.       Dale Elgin, MD

## 2024-01-21 NOTE — Assessment & Plan Note (Signed)
Previously worked up by oncology. Last CT scan stable. Have discussed f/u CT.  Breathing stable. Recent CXR ok.

## 2024-01-21 NOTE — Assessment & Plan Note (Addendum)
Physical today 01/21/24.  Declines colonoscopy.  Mammogram 01/10/24 - Birads I.  Bone density 04/03/22 - normal.

## 2024-01-21 NOTE — Assessment & Plan Note (Signed)
 Continue crestor

## 2024-01-25 ENCOUNTER — Encounter: Payer: Self-pay | Admitting: Internal Medicine

## 2024-01-25 NOTE — Assessment & Plan Note (Signed)
Bone density normal

## 2024-01-25 NOTE — Assessment & Plan Note (Signed)
 Evaluated by Dr Dalbert Garnet. Had f/u pelvic ultrasound 03/27/22 - stable. Recommended f/u in 6 months. She has wanted to hold on f/u pelvic ultrasound. Had CA 125 07/2023 - 7.  Agreeable to f/u CA 125 next labs.

## 2024-03-08 ENCOUNTER — Other Ambulatory Visit: Payer: Self-pay | Admitting: Internal Medicine

## 2024-05-01 ENCOUNTER — Telehealth: Payer: Self-pay | Admitting: Internal Medicine

## 2024-05-01 NOTE — Telephone Encounter (Signed)
 Lm  and MyChart note sent.Dr Geralyn Knee will not be in the office on June 20th. Please call and reschedule your appointment. [id number] please reschedule appointment.

## 2024-05-15 ENCOUNTER — Other Ambulatory Visit (INDEPENDENT_AMBULATORY_CARE_PROVIDER_SITE_OTHER): Payer: Medicare HMO

## 2024-05-15 DIAGNOSIS — N83201 Unspecified ovarian cyst, right side: Secondary | ICD-10-CM | POA: Diagnosis not present

## 2024-05-15 DIAGNOSIS — E7800 Pure hypercholesterolemia, unspecified: Secondary | ICD-10-CM | POA: Diagnosis not present

## 2024-05-15 LAB — HEPATIC FUNCTION PANEL
ALT: 19 U/L (ref 0–35)
AST: 22 U/L (ref 0–37)
Albumin: 3.8 g/dL (ref 3.5–5.2)
Alkaline Phosphatase: 60 U/L (ref 39–117)
Bilirubin, Direct: 0.1 mg/dL (ref 0.0–0.3)
Total Bilirubin: 0.5 mg/dL (ref 0.2–1.2)
Total Protein: 6.1 g/dL (ref 6.0–8.3)

## 2024-05-15 LAB — LIPID PANEL
Cholesterol: 138 mg/dL (ref 0–200)
HDL: 45.9 mg/dL (ref >39.00)
LDL Cholesterol: 70 mg/dL (ref 0–99)
NonHDL: 92.41
Total CHOL/HDL Ratio: 3
Triglycerides: 110 mg/dL (ref 0.0–149.0)
VLDL: 22 mg/dL (ref 0.0–40.0)

## 2024-05-15 LAB — BASIC METABOLIC PANEL WITH GFR
BUN: 15 mg/dL (ref 6–23)
CO2: 29 meq/L (ref 19–32)
Calcium: 9.1 mg/dL (ref 8.4–10.5)
Chloride: 105 meq/L (ref 96–112)
Creatinine, Ser: 1.09 mg/dL (ref 0.40–1.20)
GFR: 46.55 mL/min — ABNORMAL LOW (ref >60.00)
Glucose, Bld: 80 mg/dL (ref 70–99)
Potassium: 4.5 meq/L (ref 3.5–5.1)
Sodium: 141 meq/L (ref 135–145)

## 2024-05-18 LAB — CA 125: CA 125: 7 U/mL (ref <35)

## 2024-05-19 ENCOUNTER — Ambulatory Visit: Payer: Self-pay | Admitting: Internal Medicine

## 2024-05-22 ENCOUNTER — Ambulatory Visit: Payer: Medicare HMO | Admitting: Internal Medicine

## 2024-06-01 ENCOUNTER — Telehealth: Payer: Self-pay | Admitting: Internal Medicine

## 2024-06-01 DIAGNOSIS — R944 Abnormal results of kidney function studies: Secondary | ICD-10-CM

## 2024-06-01 NOTE — Telephone Encounter (Signed)
 Patient need labs ordered please

## 2024-06-03 ENCOUNTER — Other Ambulatory Visit: Payer: Self-pay | Admitting: Internal Medicine

## 2024-06-12 ENCOUNTER — Other Ambulatory Visit (INDEPENDENT_AMBULATORY_CARE_PROVIDER_SITE_OTHER)

## 2024-06-12 DIAGNOSIS — R944 Abnormal results of kidney function studies: Secondary | ICD-10-CM | POA: Diagnosis not present

## 2024-06-12 LAB — BASIC METABOLIC PANEL WITH GFR
BUN: 14 mg/dL (ref 6–23)
CO2: 31 meq/L (ref 19–32)
Calcium: 9.2 mg/dL (ref 8.4–10.5)
Chloride: 105 meq/L (ref 96–112)
Creatinine, Ser: 1.09 mg/dL (ref 0.40–1.20)
GFR: 46.52 mL/min — ABNORMAL LOW (ref >60.00)
Glucose, Bld: 85 mg/dL (ref 70–99)
Potassium: 4.5 meq/L (ref 3.5–5.1)
Sodium: 141 meq/L (ref 135–145)

## 2024-06-14 ENCOUNTER — Ambulatory Visit: Payer: Self-pay | Admitting: Internal Medicine

## 2024-07-03 ENCOUNTER — Ambulatory Visit: Admitting: Internal Medicine

## 2024-07-03 ENCOUNTER — Encounter: Payer: Self-pay | Admitting: Internal Medicine

## 2024-07-03 VITALS — BP 128/70 | HR 62 | Resp 16 | Ht 65.0 in | Wt 161.2 lb

## 2024-07-03 DIAGNOSIS — N83201 Unspecified ovarian cyst, right side: Secondary | ICD-10-CM

## 2024-07-03 DIAGNOSIS — E7800 Pure hypercholesterolemia, unspecified: Secondary | ICD-10-CM

## 2024-07-03 DIAGNOSIS — I700 Atherosclerosis of aorta: Secondary | ICD-10-CM | POA: Diagnosis not present

## 2024-07-03 DIAGNOSIS — E559 Vitamin D deficiency, unspecified: Secondary | ICD-10-CM

## 2024-07-03 DIAGNOSIS — R9389 Abnormal findings on diagnostic imaging of other specified body structures: Secondary | ICD-10-CM

## 2024-07-03 DIAGNOSIS — R053 Chronic cough: Secondary | ICD-10-CM | POA: Diagnosis not present

## 2024-07-03 DIAGNOSIS — S90221A Contusion of right lesser toe(s) with damage to nail, initial encounter: Secondary | ICD-10-CM

## 2024-07-03 DIAGNOSIS — N1831 Chronic kidney disease, stage 3a: Secondary | ICD-10-CM | POA: Diagnosis not present

## 2024-07-03 DIAGNOSIS — S90229A Contusion of unspecified lesser toe(s) with damage to nail, initial encounter: Secondary | ICD-10-CM | POA: Insufficient documentation

## 2024-07-03 MED ORDER — MEDROXYPROGESTERONE ACETATE 2.5 MG PO TABS: 2.5000 mg | ORAL_TABLET | Freq: Every day | ORAL | 1 refills | Status: AC

## 2024-07-03 MED ORDER — ROSUVASTATIN CALCIUM 20 MG PO TABS: 20.0000 mg | ORAL_TABLET | Freq: Every day | ORAL | 1 refills | Status: AC

## 2024-07-03 MED ORDER — ESTRADIOL 1 MG PO TABS: 1.0000 mg | ORAL_TABLET | Freq: Every day | ORAL | 1 refills | Status: AC

## 2024-07-03 NOTE — Progress Notes (Signed)
 Subjective:    Patient ID: Kristin Phelps, female    DOB: 1941-10-19, 83 y.o.   MRN: [id number]  Patient here for  Chief Complaint  Patient presents with   Medical Management of Chronic Issues    HPI Here for a scheduled follow up - follow up regarding hypercholesterolemia.  She reports she is doing relatively well.  Working.  Breathing stable.  Does report some cough in the mornings.  Some congestion in the morning.  Does have drainage.  When she gets it cleared in the mornings it is not an issue.  Breathing stable.  No increased shortness of breath.  No abdominal pain or cramping.  No bowel changes reported.  She is concerned regarding persistent black toenail.  Is her right great toenail.  She reports that she was at a wedding on May 10.  She bent her toes to try to keep her shoes from coming off.  Noticed after this wedding the discoloration of her toenail.  It has remained discolored.  Does not appear to have changed over these last few months.  She would like to get this checked.  She states she had a friend recently diagnosed with subungual melanoma.  No toe pain.  Discussed labs.  Discussed renal function.  Discussed further testing with ultrasound.   Past Medical History:  Diagnosis Date   Arthritis    Cancer (HCC)    basal cell   Hyperlipidemia    Nephrolithiasis    Postmenopausal HRT (hormone replacement therapy)    Pulmonary nodules    Raynaud's phenomenon    Renal stone    Past Surgical History:  Procedure Laterality Date   anal fissure surgery  9/06   LITHOTRIPSY     TONSILLECTOMY     Family History  Problem Relation Age of Onset   Breast cancer Mother        died age 38   Heart disease Father        s/p CABG   Aneurysm Father    Breast cancer Other        maternal niece   Colon cancer Neg Hx    Social History   Socioeconomic History   Marital status: Married    Spouse name: Tom   Number of children: 2   Years of education: Not on file   Highest  education level: Associate degree: occupational, Scientist, product/process development, or vocational program  Occupational History    Employer: [employer]  Tobacco Use   Smoking status: Never   Smokeless tobacco: Never  Substance and Sexual Activity   Alcohol use: No    Alcohol/week: 0.0 standard drinks of alcohol   Drug use: No   Sexual activity: Not Currently  Other Topics Concern   Not on file  Social History Narrative   Not on file   Social Drivers of Health   Financial Resource Strain: Low Risk  (06/29/2024)   Overall Financial Resource Strain (CARDIA)    Difficulty of Paying Living Expenses: Not hard at all  Food Insecurity: Unknown (06/29/2024)   Hunger Vital Sign    Worried About Running Out of Food in the Last Year: Not on file    Ran Out of Food in the Last Year: Never true  Transportation Needs: No Transportation Needs (06/29/2024)   PRAPARE - Administrator, Civil Service (Medical): No    Lack of Transportation (Non-Medical): No  Physical Activity: Inactive (06/29/2024)   Exercise Vital Sign    Days  of Exercise per Week: 0 days    Minutes of Exercise per Session: Not on file  Stress: No Stress Concern Present (06/29/2024)   [employer] of Occupational Health - Occupational Stress Questionnaire    Feeling of Stress: Not at all  Social Connections: Unknown (06/29/2024)   Social Connection and Isolation Panel    Frequency of Communication with Friends and Family: Not on file    Frequency of Social Gatherings with Friends and Family: Once a week    Attends Religious Services: More than 4 times per year    Active Member of [employer] or Organizations: Yes    Attends Banker Meetings: 1 to 4 times per year    Marital Status: Married     Review of Systems  Constitutional:  Negative for appetite change and unexpected weight change.  HENT:  Positive for postnasal drip. Negative for sinus pressure.   Respiratory:  Negative for chest tightness and shortness  of breath.        Some cough in am. Clears after.   Cardiovascular:  Negative for chest pain, palpitations and leg swelling.  Gastrointestinal:  Negative for abdominal pain, diarrhea, nausea and vomiting.  Genitourinary:  Negative for difficulty urinating and dysuria.  Musculoskeletal:  Negative for joint swelling and myalgias.  Skin:  Negative for color change and rash.  Neurological:  Negative for dizziness and headaches.  Psychiatric/Behavioral:  Negative for agitation and dysphoric mood.        Objective:     BP 128/70   Pulse 62   Resp 16   Ht 5' 5 (1.651 m)   Wt 161 lb 3.2 oz (73.1 kg)   SpO2 99%   BMI 26.83 kg/m  Wt Readings from Last 3 Encounters:  07/03/24 161 lb 3.2 oz (73.1 kg)  01/21/24 158 lb 3.2 oz (71.8 kg)  10/18/23 155 lb 6.4 oz (70.5 kg)    Physical Exam Vitals reviewed.  Constitutional:      General: She is not in acute distress.    Appearance: Normal appearance.  HENT:     Head: Normocephalic and atraumatic.     Right Ear: External ear normal.     Left Ear: External ear normal.     Mouth/Throat:     Pharynx: No oropharyngeal exudate or posterior oropharyngeal erythema.  Eyes:     General: No scleral icterus.       Right eye: No discharge.        Left eye: No discharge.     Conjunctiva/sclera: Conjunctivae normal.  Neck:     Thyroid : No thyromegaly.  Cardiovascular:     Rate and Rhythm: Normal rate and regular rhythm.  Pulmonary:     Effort: No respiratory distress.     Breath sounds: Normal breath sounds. No wheezing.  Abdominal:     General: Bowel sounds are normal.     Palpations: Abdomen is soft.     Tenderness: There is no abdominal tenderness.  Musculoskeletal:        General: No swelling or tenderness.     Cervical back: Neck supple. No tenderness.  Lymphadenopathy:     Cervical: No cervical adenopathy.  Skin:    Findings: No erythema or rash.     Comments: Right great toe nail - discoloration (black). No pain to palpation. No  swelling.   Neurological:     Mental Status: She is alert.  Psychiatric:        Mood and Affect: Mood normal.  Behavior: Behavior normal.         Outpatient Encounter Medications as of 07/03/2024  Medication Sig   Cholecalciferol (VITAMIN D -3) 1000 UNITS CAPS Take 1 capsule by mouth daily.   estradiol  (ESTRACE ) 1 MG tablet Take 1 tablet (1 mg total) by mouth daily.   medroxyPROGESTERone  (PROVERA ) 2.5 MG tablet Take 1 tablet (2.5 mg total) by mouth daily.   polycarbophil (FIBERCON) 625 MG tablet Take 625 mg by mouth daily.   rosuvastatin  (CRESTOR ) 20 MG tablet Take 1 tablet (20 mg total) by mouth daily.   [DISCONTINUED] estradiol  (ESTRACE ) 1 MG tablet Take 1 tablet (1 mg total) by mouth daily.   [DISCONTINUED] medroxyPROGESTERone  (PROVERA ) 2.5 MG tablet Take 1 tablet (2.5 mg total) by mouth daily.   [DISCONTINUED] rosuvastatin  (CRESTOR ) 20 MG tablet Take 1 tablet by mouth once daily   No facility-administered encounter medications on file as of 07/03/2024.     Lab Results  Component Value Date   WBC 7.6 01/08/2023   HGB 13.0 01/08/2023   HCT 39.3 01/08/2023   PLT 249.0 01/08/2023   GLUCOSE 85 06/12/2024   CHOL 138 05/15/2024   TRIG 110.0 05/15/2024   HDL 45.90 05/15/2024   LDLCALC 70 05/15/2024   ALT 19 05/15/2024   AST 22 05/15/2024   NA 141 06/12/2024   K 4.5 06/12/2024   CL 105 06/12/2024   CREATININE 1.09 06/12/2024   BUN 14 06/12/2024   CO2 31 06/12/2024   TSH 4.29 01/08/2023    MM 3D SCREENING MAMMOGRAM BILATERAL BREAST Result Date: 01/14/2024 CLINICAL DATA:  Screening. EXAM: DIGITAL SCREENING BILATERAL MAMMOGRAM WITH TOMOSYNTHESIS AND CAD TECHNIQUE: Bilateral screening digital craniocaudal and mediolateral oblique mammograms were obtained. Bilateral screening digital breast tomosynthesis was performed. The images were evaluated with computer-aided detection. COMPARISON:  Previous exam(s). ACR Breast Density Category c: The breasts are heterogeneously dense,  which may obscure small masses. FINDINGS: There are no findings suspicious for malignancy. IMPRESSION: No mammographic evidence of malignancy. A result letter of this screening mammogram will be mailed directly to the patient. RECOMMENDATION: Screening mammogram in one year. (Code:SM-B-01Y) BI-RADS CATEGORY  1: Negative. Electronically Signed   By: Debby Satterfield M.D.   On: 01/14/2024 10:08       Assessment & Plan:  CKD stage 3a, GFR 45-59 ml/min (HCC) Assessment & Plan: GFR has remained decreased.  Discussed today.  She is avoiding anti-inflammatory medications.  Trying to stay hydrated.  Check renal ultrasound.  Orders: -     US  RENAL; Future  Hypercholesterolemia Assessment & Plan: On crestor .  Low-cholesterol diet and exercise.  Follow fasting lipid profile and liver panel. No changes in medication today.   Orders: -     Hepatic function panel; Future -     Basic metabolic panel with GFR; Future -     Lipid panel; Future -     TSH; Future -     CBC with Differential/Platelet; Future  Cyst of right ovary Assessment & Plan: Evaluated by Dr Verdon. Had f/u pelvic ultrasound 03/27/22 - stable. Recommended f/u in 6 months. She has wanted to hold on f/u pelvic ultrasound. Had CA 125 07/2023 - 7.  CA 125 next 05/15/24 - 7. Follow.    Contusion of toenail of right foot, initial encounter Assessment & Plan: Discoloration of her toenail as outlined.  Persistent blackened toenail.  No pain.  Will have dermatology evaluate as outlined.  Orders: -     Ambulatory referral to Dermatology  Abnormal chest CT Assessment &  Plan: Previously worked up by oncology.  Last CT scan stable.  I have discussed follow-up CT scans with her.  Breathing is stable.  She does report the a.m. cough.  We discussed using steroid nasal spray and saline nasal spray on a regular basis.  She desires no further scanning or testing.   Aortic atherosclerosis (HCC) Assessment & Plan: Continue  Crestor .   Persistent cough Assessment & Plan: Cough persistent in the AM.  She feels it is related to drainage.  Discussed steroid nasal spray and saline nasal spray.  Wants to hold on further x-ray or testing.   Vitamin D  deficiency Assessment & Plan: Check vitamin D  level with next labs.  Orders: -     VITAMIN D  25 Hydroxy (Vit-D Deficiency, Fractures); Future  Other orders -     Estradiol ; Take 1 tablet (1 mg total) by mouth daily.  Dispense: 90 tablet; Refill: 1 -     medroxyPROGESTERone  Acetate; Take 1 tablet (2.5 mg total) by mouth daily.  Dispense: 90 tablet; Refill: 1 -     Rosuvastatin  Calcium ; Take 1 tablet (20 mg total) by mouth daily.  Dispense: 90 tablet; Refill: 1     Allena Hamilton, MD

## 2024-07-03 NOTE — Assessment & Plan Note (Signed)
 Previously worked up by oncology.  Last CT scan stable.  I have discussed follow-up CT scans with her.  Breathing is stable.  She does report the a.m. cough.  We discussed using steroid nasal spray and saline nasal spray on a regular basis.  She desires no further scanning or testing.

## 2024-07-03 NOTE — Assessment & Plan Note (Signed)
 Cough persistent in the AM.  She feels it is related to drainage.  Discussed steroid nasal spray and saline nasal spray.  Wants to hold on further x-ray or testing.

## 2024-07-03 NOTE — Assessment & Plan Note (Signed)
 Check vitamin D level with next labs.  ?

## 2024-07-03 NOTE — Assessment & Plan Note (Signed)
 Evaluated by Dr Verdon. Had f/u pelvic ultrasound 03/27/22 - stable. Recommended f/u in 6 months. She has wanted to hold on f/u pelvic ultrasound. Had CA 125 07/2023 - 7.  CA 125 next 05/15/24 - 7. Follow.

## 2024-07-03 NOTE — Assessment & Plan Note (Signed)
 On crestor .  Low-cholesterol diet and exercise.  Follow fasting lipid profile and liver panel. No changes in medication today.

## 2024-07-03 NOTE — Assessment & Plan Note (Signed)
 GFR has remained decreased.  Discussed today.  She is avoiding anti-inflammatory medications.  Trying to stay hydrated.  Check renal ultrasound.

## 2024-07-03 NOTE — Assessment & Plan Note (Signed)
 Discoloration of her toenail as outlined.  Persistent blackened toenail.  No pain.  Will have dermatology evaluate as outlined.

## 2024-07-03 NOTE — Assessment & Plan Note (Signed)
 Continue Crestor

## 2024-07-09 ENCOUNTER — Ambulatory Visit
Admission: RE | Admit: 2024-07-09 | Discharge: 2024-07-09 | Disposition: A | Discharge status: Home / Self Care | Source: Ambulatory Visit | Attending: Internal Medicine | Admitting: Internal Medicine

## 2024-07-09 DIAGNOSIS — N1831 Chronic kidney disease, stage 3a: Secondary | ICD-10-CM | POA: Insufficient documentation

## 2024-07-09 DIAGNOSIS — N281 Cyst of kidney, acquired: Secondary | ICD-10-CM | POA: Diagnosis not present

## 2024-07-14 DIAGNOSIS — L2989 Other pruritus: Secondary | ICD-10-CM | POA: Diagnosis not present

## 2024-07-14 DIAGNOSIS — L609 Nail disorder, unspecified: Secondary | ICD-10-CM | POA: Diagnosis not present

## 2024-07-21 ENCOUNTER — Telehealth: Payer: Self-pay

## 2024-07-21 NOTE — Telephone Encounter (Signed)
 Called patient to let her know we are still waiting on results and there has been a delay in results. If not back within a couple of days, will call read room. Pt gave verbal understanding.

## 2024-07-21 NOTE — Telephone Encounter (Signed)
 Copied from CRM [id number]. Topic: Clinical - Lab/Test Results >> Jul 21, 2024 11:01 AM Dedra B wrote: Reason for CRM: Pt called in regarding results for her renal US . Pls give pt a call.

## 2024-07-23 ENCOUNTER — Ambulatory Visit: Payer: Self-pay | Admitting: Internal Medicine

## 2024-07-29 DIAGNOSIS — L2989 Other pruritus: Secondary | ICD-10-CM | POA: Diagnosis not present

## 2024-08-12 DIAGNOSIS — S90112A Contusion of left great toe without damage to nail, initial encounter: Secondary | ICD-10-CM | POA: Diagnosis not present

## 2024-08-12 DIAGNOSIS — S90111A Contusion of right great toe without damage to nail, initial encounter: Secondary | ICD-10-CM | POA: Diagnosis not present

## 2024-09-15 ENCOUNTER — Encounter: Payer: Self-pay | Admitting: Internal Medicine

## 2024-09-15 ENCOUNTER — Ambulatory Visit: Admitting: Internal Medicine

## 2024-09-15 VITALS — BP 110/60 | HR 64 | Temp 97.8°F | Ht 65.0 in | Wt 160.0 lb

## 2024-09-15 DIAGNOSIS — S90229D Contusion of unspecified lesser toe(s) with damage to nail, subsequent encounter: Secondary | ICD-10-CM | POA: Diagnosis not present

## 2024-09-15 DIAGNOSIS — E7800 Pure hypercholesterolemia, unspecified: Secondary | ICD-10-CM | POA: Diagnosis not present

## 2024-09-15 DIAGNOSIS — N83201 Unspecified ovarian cyst, right side: Secondary | ICD-10-CM

## 2024-09-15 DIAGNOSIS — E559 Vitamin D deficiency, unspecified: Secondary | ICD-10-CM | POA: Diagnosis not present

## 2024-09-15 DIAGNOSIS — R9389 Abnormal findings on diagnostic imaging of other specified body structures: Secondary | ICD-10-CM | POA: Diagnosis not present

## 2024-09-15 DIAGNOSIS — N1831 Chronic kidney disease, stage 3a: Secondary | ICD-10-CM

## 2024-09-15 DIAGNOSIS — I700 Atherosclerosis of aorta: Secondary | ICD-10-CM | POA: Diagnosis not present

## 2024-09-15 LAB — HEPATIC FUNCTION PANEL
ALT: 16 U/L (ref 0–35)
AST: 21 U/L (ref 0–37)
Albumin: 4.2 g/dL (ref 3.5–5.2)
Alkaline Phosphatase: 61 U/L (ref 39–117)
Bilirubin, Direct: 0.1 mg/dL (ref 0.0–0.3)
Total Bilirubin: 0.6 mg/dL (ref 0.2–1.2)
Total Protein: 6.4 g/dL (ref 6.0–8.3)

## 2024-09-15 LAB — BASIC METABOLIC PANEL WITH GFR
BUN: 15 mg/dL (ref 6–23)
CO2: 28 meq/L (ref 19–32)
Calcium: 9.1 mg/dL (ref 8.4–10.5)
Chloride: 104 meq/L (ref 96–112)
Creatinine, Ser: 1.03 mg/dL (ref 0.40–1.20)
GFR: 49.7 mL/min — ABNORMAL LOW (ref >60.00)
Glucose, Bld: 85 mg/dL (ref 70–99)
Potassium: 4.5 meq/L (ref 3.5–5.1)
Sodium: 140 meq/L (ref 135–145)

## 2024-09-15 LAB — LIPID PANEL
Cholesterol: 134 mg/dL (ref 0–200)
HDL: 50.5 mg/dL (ref >39.00)
LDL Cholesterol: 63 mg/dL (ref 0–99)
NonHDL: 83.36
Total CHOL/HDL Ratio: 3
Triglycerides: 104 mg/dL (ref 0.0–149.0)
VLDL: 20.8 mg/dL (ref 0.0–40.0)

## 2024-09-15 LAB — CBC WITH DIFFERENTIAL/PLATELET
Basophils Absolute: 0 K/uL (ref 0.0–0.1)
Basophils Relative: 0.6 % (ref 0.0–3.0)
Eosinophils Absolute: 0.6 K/uL (ref 0.0–0.7)
Eosinophils Relative: 7.1 % — ABNORMAL HIGH (ref 0.0–5.0)
HCT: 39 % (ref 36.0–46.0)
Hemoglobin: 12.9 g/dL (ref 12.0–15.0)
Lymphocytes Relative: 16 % (ref 12.0–46.0)
Lymphs Abs: 1.3 K/uL (ref 0.7–4.0)
MCHC: 33 g/dL (ref 30.0–36.0)
MCV: 90.9 fl (ref 78.0–100.0)
Monocytes Absolute: 0.6 K/uL (ref 0.1–1.0)
Monocytes Relative: 7.8 % (ref 3.0–12.0)
Neutro Abs: 5.4 K/uL (ref 1.4–7.7)
Neutrophils Relative %: 68.5 % (ref 43.0–77.0)
Platelets: 240 K/uL (ref 150.0–400.0)
RBC: 4.29 Mil/uL (ref 3.87–5.11)
RDW: 12.9 % (ref 11.5–15.5)
WBC: 7.8 K/uL (ref 4.0–10.5)

## 2024-09-15 LAB — VITAMIN D 25 HYDROXY (VIT D DEFICIENCY, FRACTURES): VITD: 35.9 ng/mL (ref 30.00–100.00)

## 2024-09-15 LAB — TSH: TSH: 2.68 u[IU]/mL (ref 0.35–5.50)

## 2024-09-15 NOTE — Progress Notes (Signed)
 Subjective:    Patient ID: Kristin Phelps, female    DOB: 12-12-1940, 83 y.o.   MRN: [id number]  Patient here for  Chief Complaint  Patient presents with   Medical Management of Chronic Issues    10 wk f/u and fasting labs  Bilateral great toe nails are turning black-right started in May & left started in September    HPI Here for a scheduled follow up - follow up regarding CKD and hypercholesterolemia. Last visit, concerned regarding persistent black toe nail. Was referred to dermatology. Evaluated. Was questioning if related to trauma. Now reports blackened toe nail (great toe nail) - left and right. (Right toe nail - black - since 04/11/24 and left great toe nail after 08/03/24). Request referral to podiatry. Stays active. No chest pain or sob reported. Lhip/LLQ discomfort. Wants to montior.    Past Medical History:  Diagnosis Date   Arthritis    Cancer (HCC)    basal cell   Hyperlipidemia    Nephrolithiasis    Postmenopausal HRT (hormone replacement therapy)    Pulmonary nodules    Raynaud's phenomenon    Renal stone    Past Surgical History:  Procedure Laterality Date   anal fissure surgery  9/06   LITHOTRIPSY     TONSILLECTOMY     Family History  Problem Relation Age of Onset   Breast cancer Mother        died age 62   Heart disease Father        s/p CABG   Aneurysm Father    Breast cancer Other        maternal niece   Colon cancer Neg Hx    Social History   Socioeconomic History   Marital status: Married    Spouse name: Tom   Number of children: 2   Years of education: Not on file   Highest education level: Associate degree: academic program  Occupational History    Employer: [employer]  Tobacco Use   Smoking status: Never   Smokeless tobacco: Never  Substance and Sexual Activity   Alcohol use: No    Alcohol/week: 0.0 standard drinks of alcohol   Drug use: No   Sexual activity: Not Currently  Other Topics Concern   Not on file   Social History Narrative   Not on file   Social Drivers of Health   Financial Resource Strain: Low Risk  (09/12/2024)   Overall Financial Resource Strain (CARDIA)    Difficulty of Paying Living Expenses: Not hard at all  Food Insecurity: No Food Insecurity (09/12/2024)   Hunger Vital Sign    Worried About Running Out of Food in the Last Year: Never true    Ran Out of Food in the Last Year: Never true  Transportation Needs: No Transportation Needs (09/12/2024)   PRAPARE - Administrator, Civil Service (Medical): No    Lack of Transportation (Non-Medical): No  Physical Activity: Unknown (09/12/2024)   Exercise Vital Sign    Days of Exercise per Week: Patient declined    Minutes of Exercise per Session: Not on file  Recent Concern: Physical Activity - Inactive (06/29/2024)   Exercise Vital Sign    Days of Exercise per Week: 0 days    Minutes of Exercise per Session: Not on file  Stress: No Stress Concern Present (09/12/2024)   [employer] of Occupational Health - Occupational Stress Questionnaire    Feeling of Stress: Not at all  Social  Connections: Unknown (09/12/2024)   Social Connection and Isolation Panel    Frequency of Communication with Friends and Family: Not on file    Frequency of Social Gatherings with Friends and Family: Once a week    Attends Religious Services: More than 4 times per year    Active Member of [employer] or Organizations: Yes    Attends Banker Meetings: 1 to 4 times per year    Marital Status: Married     Review of Systems  Constitutional:  Negative for appetite change and unexpected weight change.  HENT:  Negative for congestion and sinus pressure.   Respiratory:  Negative for cough, chest tightness and shortness of breath.   Cardiovascular:  Negative for chest pain, palpitations and leg swelling.  Gastrointestinal:  Negative for abdominal pain, diarrhea, nausea and vomiting.  Genitourinary:  Negative for difficulty  urinating and dysuria.  Musculoskeletal:  Negative for joint swelling and myalgias.  Skin:  Negative for color change and rash.  Neurological:  Negative for dizziness and headaches.  Psychiatric/Behavioral:  Negative for agitation and dysphoric mood.        Objective:     BP 110/60 (Cuff Size: Normal)   Pulse 64   Temp 97.8 F (36.6 C) (Oral)   Ht 5' 5 (1.651 m)   Wt 160 lb (72.6 kg)   SpO2 97%   BMI 26.63 kg/m  Wt Readings from Last 3 Encounters:  09/15/24 160 lb (72.6 kg)  07/03/24 161 lb 3.2 oz (73.1 kg)  01/21/24 158 lb 3.2 oz (71.8 kg)    Physical Exam Vitals reviewed.  Constitutional:      General: She is not in acute distress.    Appearance: Normal appearance.  HENT:     Head: Normocephalic and atraumatic.     Right Ear: External ear normal.     Left Ear: External ear normal.     Mouth/Throat:     Pharynx: No oropharyngeal exudate or posterior oropharyngeal erythema.  Eyes:     General: No scleral icterus.       Right eye: No discharge.        Left eye: No discharge.     Conjunctiva/sclera: Conjunctivae normal.  Neck:     Thyroid : No thyromegaly.  Cardiovascular:     Rate and Rhythm: Normal rate and regular rhythm.  Pulmonary:     Effort: No respiratory distress.     Breath sounds: Normal breath sounds. No wheezing.  Abdominal:     General: Bowel sounds are normal.     Palpations: Abdomen is soft.     Tenderness: There is no abdominal tenderness.  Musculoskeletal:        General: No swelling or tenderness.     Cervical back: Neck supple. No tenderness.  Lymphadenopathy:     Cervical: No cervical adenopathy.  Skin:    Findings: No erythema or rash.  Neurological:     Mental Status: She is alert.  Psychiatric:        Mood and Affect: Mood normal.        Behavior: Behavior normal.         Outpatient Encounter Medications as of 09/15/2024  Medication Sig   Cholecalciferol (VITAMIN D -3) 1000 UNITS CAPS Take 1 capsule by mouth daily.    estradiol  (ESTRACE ) 1 MG tablet Take 1 tablet (1 mg total) by mouth daily.   medroxyPROGESTERone  (PROVERA ) 2.5 MG tablet Take 1 tablet (2.5 mg total) by mouth daily.   polycarbophil (FIBERCON) 625 MG  tablet Take 625 mg by mouth daily.   rosuvastatin  (CRESTOR ) 20 MG tablet Take 1 tablet (20 mg total) by mouth daily.   No facility-administered encounter medications on file as of 09/15/2024.     Lab Results  Component Value Date   WBC 7.8 09/15/2024   HGB 12.9 09/15/2024   HCT 39.0 09/15/2024   PLT 240.0 09/15/2024   GLUCOSE 85 09/15/2024   CHOL 134 09/15/2024   TRIG 104.0 09/15/2024   HDL 50.50 09/15/2024   LDLCALC 63 09/15/2024   ALT 16 09/15/2024   AST 21 09/15/2024   NA 140 09/15/2024   K 4.5 09/15/2024   CL 104 09/15/2024   CREATININE 1.03 09/15/2024   BUN 15 09/15/2024   CO2 28 09/15/2024   TSH 2.68 09/15/2024    US  Renal Result Date: 07/22/2024 CLINICAL DATA:  Chronic kidney disease stage 3 a. EXAM: RENAL / URINARY TRACT ULTRASOUND COMPLETE COMPARISON:  CT 03/21/2021 FINDINGS: Right Kidney: Renal measurements: 8.1 x 4.4 x 4.7 cm = volume: 88 mL. Normal parenchymal echogenicity. No hydronephrosis. No visualized stone. Single cyst measuring 1.2 cm in the lower kidney. No evidence of solid lesion. Left Kidney: Renal measurements: 10 x 5.1 x 4.9 cm = volume: 117 mL. Normal parenchymal echogenicity. No hydronephrosis. No visualized stone or focal lesion. Bladder: Appears normal for degree of bladder distention. Other: None. IMPRESSION: 1. Simple cyst in the left kidney needs no further imaging follow-up. 2. Otherwise unremarkable sonographic appearance of the kidneys. Electronically Signed   By: Andrea Gasman M.D.   On: 07/22/2024 15:54       Assessment & Plan:  Contusion of toenail, unspecified laterality, subsequent encounter Assessment & Plan: Blackened toe nail - right and left great toe nail. Refer to podiatry.   Orders: -     Ambulatory referral to Podiatry  Vitamin  D deficiency Assessment & Plan: Check vitamin D  level.   Orders: -     VITAMIN D  25 Hydroxy (Vit-D Deficiency, Fractures)  Hypercholesterolemia Assessment & Plan: On crestor .  Low-cholesterol diet and exercise.  Follow fasting lipid profile and liver panel. No changes in medication today.   Orders: -     CBC with Differential/Platelet -     TSH -     Lipid panel -     Basic metabolic panel with GFR -     Hepatic function panel -     Hepatic function panel; Future -     Basic metabolic panel with GFR; Future -     Lipid panel; Future -     CBC with Differential/Platelet; Future  Cyst of right ovary Assessment & Plan: Evaluated by Dr Verdon. Had f/u pelvic ultrasound 03/27/22 - stable. Recommended f/u in 6 months. She has wanted to hold on f/u pelvic ultrasound. Had CA 125 07/2023 - 7.  CA 125 next 05/15/24 - 7. Follow.    Abnormal chest CT Assessment & Plan: Previously worked up by oncology.  Last CT scan stable.  I have discussed follow-up CT scans with her.  Breathing is stable.  She does report the a.m. cough.  We discussed using steroid nasal spray and saline nasal spray on a regular basis.  Dicussed today. She desires no further scanning or testing.   Aortic atherosclerosis Assessment & Plan: Continue crestor .    CKD stage 3a, GFR 45-59 ml/min (HCC) Assessment & Plan: Avoid antiinflammatory medication. Stay hydrated. Follow metabolic panel.       Allena Hamilton, MD

## 2024-09-21 ENCOUNTER — Encounter: Payer: Self-pay | Admitting: Internal Medicine

## 2024-09-21 NOTE — Assessment & Plan Note (Signed)
 Continue crestor

## 2024-09-21 NOTE — Assessment & Plan Note (Signed)
 Blackened toe nail - right and left great toe nail. Refer to podiatry.

## 2024-09-21 NOTE — Assessment & Plan Note (Signed)
 Check vitamin D  level

## 2024-09-21 NOTE — Assessment & Plan Note (Signed)
Avoid antiinflammatory medication.  Stay hydrated.  Follow metabolic panel.

## 2024-09-21 NOTE — Assessment & Plan Note (Signed)
 On crestor .  Low-cholesterol diet and exercise.  Follow fasting lipid profile and liver panel. No changes in medication today.

## 2024-09-21 NOTE — Assessment & Plan Note (Signed)
 Previously worked up by oncology.  Last CT scan stable.  I have discussed follow-up CT scans with her.  Breathing is stable.  She does report the a.m. cough.  We discussed using steroid nasal spray and saline nasal spray on a regular basis.  Dicussed today. She desires no further scanning or testing.

## 2024-09-21 NOTE — Assessment & Plan Note (Signed)
 Evaluated by Dr Verdon. Had f/u pelvic ultrasound 03/27/22 - stable. Recommended f/u in 6 months. She has wanted to hold on f/u pelvic ultrasound. Had CA 125 07/2023 - 7.  CA 125 next 05/15/24 - 7. Follow.

## 2024-09-23 ENCOUNTER — Ambulatory Visit: Admitting: Podiatry

## 2024-09-29 ENCOUNTER — Ambulatory Visit: Admitting: Podiatry

## 2024-09-29 DIAGNOSIS — S90119A Contusion of unspecified great toe without damage to nail, initial encounter: Secondary | ICD-10-CM | POA: Diagnosis not present

## 2024-09-29 NOTE — Progress Notes (Unsigned)
  Subjective:  Patient ID: Kristin Phelps, female    DOB: 1941/01/14,  MRN: [id number]  Chief Complaint  Patient presents with   Nail Problem    83 y.o. female presents with the above complaint.  Patient presents with bilateral hallux nail contusion.  She just wanted get it evaluated.  She states is not causing her any pain or discomfort she just to make sure is nothing concerning.  Pain scale is 0 out of 10.  She just noticed that it may have happened from microtrauma.  She would just like to discuss treatment option her nail is well adhered to the underlying nailbed   Review of Systems: Negative except as noted in the HPI. Denies N/V/F/Ch.  Past Medical History:  Diagnosis Date   Arthritis    Cancer (HCC)    basal cell   Hyperlipidemia    Nephrolithiasis    Postmenopausal HRT (hormone replacement therapy)    Pulmonary nodules    Raynaud's phenomenon    Renal stone     Current Outpatient Medications:    Cholecalciferol (VITAMIN D -3) 1000 UNITS CAPS, Take 1 capsule by mouth daily., Disp: , Rfl:    estradiol  (ESTRACE ) 1 MG tablet, Take 1 tablet (1 mg total) by mouth daily., Disp: 90 tablet, Rfl: 1   medroxyPROGESTERone  (PROVERA ) 2.5 MG tablet, Take 1 tablet (2.5 mg total) by mouth daily., Disp: 90 tablet, Rfl: 1   polycarbophil (FIBERCON) 625 MG tablet, Take 625 mg by mouth daily., Disp: , Rfl:    rosuvastatin  (CRESTOR ) 20 MG tablet, Take 1 tablet (20 mg total) by mouth daily., Disp: 90 tablet, Rfl: 1  Social History   Tobacco Use  Smoking Status Never  Smokeless Tobacco Never    Allergies  Allergen Reactions   Sulfa Antibiotics    Triamcinolone  Hives   Objective:  There were no vitals filed for this visit. There is no height or weight on file to calculate BMI. Constitutional Well developed. Well nourished.  Vascular Dorsalis pedis pulses palpable bilaterally. Posterior tibial pulses palpable bilaterally. Capillary refill normal to all digits.  No cyanosis or  clubbing noted. Pedal hair growth normal.  Neurologic Normal speech. Oriented to person, place, and time. Epicritic sensation to light touch grossly present bilaterally.  Dermatologic Nails bilateral hallux nail contusion with slight hematoma.  Nail is well adhered to the underlying nailbed no active bleeding noted no signs of infection noted Skin within normal limits  Orthopedic: Normal joint ROM without pain or crepitus bilaterally. No visible deformities. No bony tenderness.   Radiographs: None Assessment:   1. Contusion of great toe without damage to nail, unspecified laterality, initial encounter    Plan:  Patient was evaluated and treated and all questions answered.  Bilateral hallux nail contusion less than 10% hematoma - All questions or concerns were discussed with the patient extensive detail at this time given that this is not causing a lot of discomfort we will hold off on doing any kind of procedure at this time.  I did discuss with her that if it continues to bother her we can remove it in the future she states understanding at this time the nail is well adhered to the underlying nailbed as well. - Shoe gear modification discussed  No follow-ups on file.

## 2024-10-06 DIAGNOSIS — H524 Presbyopia: Secondary | ICD-10-CM | POA: Diagnosis not present

## 2025-01-19 ENCOUNTER — Other Ambulatory Visit

## 2025-01-22 ENCOUNTER — Encounter: Admitting: Internal Medicine
